# Patient Record
Sex: Male | Born: 1975 | Race: Black or African American | Hispanic: No | Marital: Single | Smoking: Current every day smoker
Health system: Southern US, Community
[De-identification: ages and names within clinical notes are randomized; demographics above are authoritative.]

## PROBLEM LIST (undated history)

## (undated) DIAGNOSIS — I10 Essential (primary) hypertension: Secondary | ICD-10-CM

## (undated) DIAGNOSIS — Z21 Asymptomatic human immunodeficiency virus [HIV] infection status: Secondary | ICD-10-CM

## (undated) DIAGNOSIS — B20 Human immunodeficiency virus [HIV] disease: Secondary | ICD-10-CM

## (undated) DIAGNOSIS — F1020 Alcohol dependence, uncomplicated: Secondary | ICD-10-CM

## (undated) HISTORY — DX: Human immunodeficiency virus (HIV) disease: B20

## (undated) HISTORY — DX: Alcohol dependence, uncomplicated: F10.20

## (undated) HISTORY — DX: Asymptomatic human immunodeficiency virus (hiv) infection status: Z21

## (undated) HISTORY — PX: HAND SURGERY: SHX662

## (undated) HISTORY — DX: Essential (primary) hypertension: I10

---

## 1997-12-05 ENCOUNTER — Emergency Department (HOSPITAL_COMMUNITY): Admission: EM | Admit: 1997-12-05 | Discharge: 1997-12-05 | Payer: Self-pay | Admitting: Emergency Medicine

## 2004-04-29 ENCOUNTER — Emergency Department (HOSPITAL_COMMUNITY): Admission: EM | Admit: 2004-04-29 | Discharge: 2004-04-29 | Payer: Self-pay | Admitting: Emergency Medicine

## 2004-05-02 ENCOUNTER — Emergency Department (HOSPITAL_COMMUNITY): Admission: EM | Admit: 2004-05-02 | Discharge: 2004-05-02 | Payer: Self-pay | Admitting: Emergency Medicine

## 2006-05-11 ENCOUNTER — Inpatient Hospital Stay (HOSPITAL_COMMUNITY): Admission: EM | Admit: 2006-05-11 | Discharge: 2006-05-12 | Payer: Self-pay | Admitting: Emergency Medicine

## 2012-12-06 ENCOUNTER — Encounter (HOSPITAL_COMMUNITY): Payer: Self-pay | Admitting: Emergency Medicine

## 2012-12-06 ENCOUNTER — Emergency Department (INDEPENDENT_AMBULATORY_CARE_PROVIDER_SITE_OTHER)
Admission: EM | Admit: 2012-12-06 | Discharge: 2012-12-06 | Disposition: A | Discharge status: Home / Self Care | Payer: Self-pay | Source: Home / Self Care | Attending: Emergency Medicine | Admitting: Emergency Medicine

## 2012-12-06 DIAGNOSIS — A084 Viral intestinal infection, unspecified: Secondary | ICD-10-CM

## 2012-12-06 DIAGNOSIS — A088 Other specified intestinal infections: Secondary | ICD-10-CM

## 2012-12-06 MED ORDER — ONDANSETRON 8 MG PO TBDP: 8.0000 mg | ORAL_TABLET | Freq: Three times a day (TID) | ORAL | Status: DC | PRN

## 2012-12-06 MED ORDER — ONDANSETRON 4 MG PO TBDP
8.0000 mg | ORAL_TABLET | Freq: Once | ORAL | Status: AC
  Administered 2012-12-06: 8 mg via ORAL

## 2012-12-06 MED ORDER — GI COCKTAIL ~~LOC~~
30.0000 mL | Freq: Once | ORAL | Status: AC
  Administered 2012-12-06: 30 mL via ORAL

## 2012-12-06 MED ORDER — ONDANSETRON 4 MG PO TBDP
ORAL_TABLET | ORAL | Status: AC
  Filled 2012-12-06: qty 2

## 2012-12-06 MED ORDER — GI COCKTAIL ~~LOC~~
ORAL | Status: AC
  Filled 2012-12-06: qty 30

## 2012-12-06 MED ORDER — DIPHENOXYLATE-ATROPINE 2.5-0.025 MG PO TABS: 1.0000 | ORAL_TABLET | Freq: Four times a day (QID) | ORAL | Status: DC | PRN

## 2012-12-06 NOTE — ED Provider Notes (Signed)
Chief Complaint:   Chief Complaint  Patient presents with  . Abdominal Pain    History of Present Illness:   Gerald Guzman is a 37 year old male who's had a four-day history of nausea, vomiting, diarrhea, epigastric, crampy abdominal pain, and borborygmus. He vomited once yesterday and has not vomited today. He's been able to keep some liquids down. There's been no blood in the vomitus, no coffee-ground emesis or bilious emesis. He had one diarrheal stool yesterday and one today. He denies any blood in the stool or melena. He's had no fever, chills, sweats, aches, or headache. He's been around several nieces and nephews who had similar symptoms. He denies any suspicious ingestions or recent foreign travel. No animal exposure.  Review of Systems:  Other than noted above, the patient denies any of the following symptoms: Systemic:  No fevers, chills, sweats, weight loss or gain, fatigue, or tiredness. ENT:  No nasal congestion, rhinorrhea, or sore throat. Lungs:  No cough, wheezing, or shortness of breath. Cardiac:  No chest pain, syncope, or presyncope. GI:  No abdominal pain, nausea, vomiting, anorexia, diarrhea, constipation, blood in stool or vomitus. GU:  No dysuria, frequency, or urgency.  PMFSH:  Past medical history, family history, social history, meds, and allergies were reviewed.    Physical Exam:   Vital signs:  BP 133/92  Pulse 57  Temp(Src) 98.3 F (36.8 C) (Oral)  Resp 16  SpO2 97% General:  Alert and oriented.  In no distress.  Skin warm and dry.  Good skin turgor, brisk capillary refill. ENT:  No scleral icterus, moist mucous membranes, no oral lesions, pharynx clear. Lungs:  Breath sounds clear and equal bilaterally.  No wheezes, rales, or rhonchi. Heart:  Rhythm regular, without extrasystoles.  No gallops or murmers. Abdomen:  Soft, flat, nondistended. He has slight epigastric and periumbilical tenderness to palpation without guarding or rebound. No organomegaly or  mass. Bowel sounds are hyperactive with loud borborygmus. Skin: Clear, warm, and dry.  Good turgor.  Brisk capillary refill.  Course in Urgent Care Center:   He was given GI cocktail 30 mL by mouth and Zofran ODT 8 mg sublingually.  Assessment:  The encounter diagnosis was Viral gastroenteritis.  No evidence of dehydration.  Plan:   1.  Meds:  The following meds were prescribed:   New Prescriptions   DIPHENOXYLATE-ATROPINE (LOMOTIL) 2.5-0.025 MG PER TABLET    Take 1 tablet by mouth 4 (four) times daily as needed for diarrhea or loose stools.   ONDANSETRON (ZOFRAN ODT) 8 MG DISINTEGRATING TABLET    Take 1 tablet (8 mg total) by mouth every 8 (eight) hours as needed for nausea.    2.  Patient Education/Counseling:  The patient was given appropriate handouts, self care instructions, and instructed in symptomatic relief. The patient was told to stay on clear liquids for the remainder of the day, then advance to a B.R.A.T. diet starting tomorrow.   3.  Follow up:  The patient was told to follow up if no better in 3 to 4 days, if becoming worse in any way, and given some red flag symptoms such as persistent vomiting, fever, or severe abdominal pain which would prompt immediate return.  Follow up here if necessary.       Reuben Likes, MD 12/06/12 [phone]

## 2012-12-06 NOTE — ED Notes (Signed)
General abdominal pain, nausea, vomiting, and diarrhea

## 2013-05-25 ENCOUNTER — Encounter (HOSPITAL_COMMUNITY): Payer: Self-pay | Admitting: Emergency Medicine

## 2013-05-25 ENCOUNTER — Emergency Department (HOSPITAL_COMMUNITY)
Admission: EM | Admit: 2013-05-25 | Discharge: 2013-05-25 | Disposition: A | Discharge status: Home / Self Care | Payer: Self-pay | Attending: Emergency Medicine | Admitting: Emergency Medicine

## 2013-05-25 DIAGNOSIS — F17200 Nicotine dependence, unspecified, uncomplicated: Secondary | ICD-10-CM | POA: Insufficient documentation

## 2013-05-25 DIAGNOSIS — K2970 Gastritis, unspecified, without bleeding: Secondary | ICD-10-CM | POA: Insufficient documentation

## 2013-05-25 DIAGNOSIS — K2990 Gastroduodenitis, unspecified, without bleeding: Principal | ICD-10-CM

## 2013-05-25 LAB — CBC WITH DIFFERENTIAL/PLATELET
BASOS ABS: 0 10*3/uL (ref 0.0–0.1)
Basophils Relative: 0 % (ref 0–1)
Eosinophils Absolute: 0.1 10*3/uL (ref 0.0–0.7)
Eosinophils Relative: 1 % (ref 0–5)
HEMATOCRIT: 46.4 % (ref 39.0–52.0)
HEMOGLOBIN: 16.1 g/dL (ref 13.0–17.0)
LYMPHS ABS: 2.7 10*3/uL (ref 0.7–4.0)
LYMPHS PCT: 36 % (ref 12–46)
MCH: 32.8 pg (ref 26.0–34.0)
MCHC: 34.7 g/dL (ref 30.0–36.0)
MCV: 94.5 fL (ref 78.0–100.0)
Monocytes Absolute: 0.5 10*3/uL (ref 0.1–1.0)
Monocytes Relative: 6 % (ref 3–12)
Neutro Abs: 4.2 10*3/uL (ref 1.7–7.7)
Neutrophils Relative %: 57 % (ref 43–77)
Platelets: 233 10*3/uL (ref 150–400)
RBC: 4.91 MIL/uL (ref 4.22–5.81)
RDW: 13.5 % (ref 11.5–15.5)
WBC: 7.4 10*3/uL (ref 4.0–10.5)

## 2013-05-25 LAB — COMPREHENSIVE METABOLIC PANEL
ALBUMIN: 4 g/dL (ref 3.5–5.2)
ALK PHOS: 75 U/L (ref 39–117)
ALT: 7 U/L (ref 0–53)
AST: 29 U/L (ref 0–37)
BILIRUBIN TOTAL: 0.4 mg/dL (ref 0.3–1.2)
BUN: 7 mg/dL (ref 6–23)
CHLORIDE: 100 meq/L (ref 96–112)
CO2: 26 mEq/L (ref 19–32)
CREATININE: 1.18 mg/dL (ref 0.50–1.35)
Calcium: 9.1 mg/dL (ref 8.4–10.5)
GFR calc non Af Amer: 77 mL/min — ABNORMAL LOW (ref >90)
GFR, EST AFRICAN AMERICAN: 89 mL/min — AB (ref >90)
GLUCOSE: 97 mg/dL (ref 70–99)
POTASSIUM: 4.3 meq/L (ref 3.7–5.3)
SODIUM: 139 meq/L (ref 137–147)
TOTAL PROTEIN: 7.6 g/dL (ref 6.0–8.3)

## 2013-05-25 LAB — URINALYSIS, ROUTINE W REFLEX MICROSCOPIC
Bilirubin Urine: NEGATIVE
GLUCOSE, UA: NEGATIVE mg/dL
Hgb urine dipstick: NEGATIVE
KETONES UR: NEGATIVE mg/dL
NITRITE: NEGATIVE
PH: 5 (ref 5.0–8.0)
PROTEIN: NEGATIVE mg/dL
Specific Gravity, Urine: 1.019 (ref 1.005–1.030)
UROBILINOGEN UA: 0.2 mg/dL (ref 0.0–1.0)

## 2013-05-25 LAB — URINE MICROSCOPIC-ADD ON

## 2013-05-25 LAB — LIPASE, BLOOD: Lipase: 17 U/L (ref 11–59)

## 2013-05-25 MED ORDER — PANTOPRAZOLE SODIUM 20 MG PO TBEC: 20.0000 mg | DELAYED_RELEASE_TABLET | Freq: Every day | ORAL | Status: DC

## 2013-05-25 MED ORDER — SUCRALFATE 1 G PO TABS: 1.0000 g | ORAL_TABLET | Freq: Four times a day (QID) | ORAL | Status: DC

## 2013-05-25 NOTE — Discharge Instructions (Signed)

## 2013-05-25 NOTE — ED Notes (Addendum)
Pt reports n/v since Monday. Denies bowel/bladder changes

## 2013-05-25 NOTE — ED Notes (Signed)
MD at bedside. 

## 2013-05-25 NOTE — ED Provider Notes (Signed)
CSN: [id number]     Arrival date & time 05/25/13  1219 History   First MD Initiated Contact with Patient 05/25/13 1240     Chief Complaint  Patient presents with  . Nausea     (Consider location/radiation/quality/duration/timing/severity/associated sxs/prior Treatment) The history is provided by the patient.   Patient here complaining of nausea and diarrhea that is worse with eating greasy foods and better with a bland diet. Denies any fever or chills. Denies any abdominal pain. Symptoms have gradually improved over the past week. He is concerned because he's had watery diarrhea. Denies any antibiotic use. No exposure C. difficile. No recent travel history. Denies exposure to gastroenteritis. Denies any weakness or dizziness when standing. No URI symptoms. History reviewed. No pertinent past medical history. History reviewed. No pertinent past surgical history. History reviewed. No pertinent family history. History  Substance Use Topics  . Smoking status: Current Every Day Smoker    Types: Cigarettes  . Smokeless tobacco: Not on file  . Alcohol Use: Yes    Review of Systems  All other systems reviewed and are negative.     Allergies  Review of patient's allergies indicates no known allergies.  Home Medications   Prior to Admission medications   Medication Sig Start Date End Date Taking? Authorizing Provider  diphenoxylate-atropine (LOMOTIL) 2.5-0.025 MG per tablet Take 1 tablet by mouth 4 (four) times daily as needed for diarrhea or loose stools. 12/06/12   Reuben Likesavid C Keller, MD  ondansetron (ZOFRAN ODT) 8 MG disintegrating tablet Take 1 tablet (8 mg total) by mouth every 8 (eight) hours as needed for nausea. 12/06/12   Reuben Likesavid C Keller, MD   BP 132/93  Pulse 65  Temp(Src) 98.2 F (36.8 C) (Oral)  Resp 16  Ht 6\' 2"  (1.88 m)  Wt 200 lb 4 oz (90.833 kg)  BMI 25.70 kg/m2  SpO2 97% Physical Exam  Nursing note and vitals reviewed. Constitutional: He is oriented to person,  place, and time. He appears well-developed and well-nourished.  Non-toxic appearance. No distress.  HENT:  Head: Normocephalic and atraumatic.  Eyes: Conjunctivae, EOM and lids are normal. Pupils are equal, round, and reactive to light.  Neck: Normal range of motion. Neck supple. No tracheal deviation present. No mass present.  Cardiovascular: Normal rate, regular rhythm and normal heart sounds.  Exam reveals no gallop.   No murmur heard. Pulmonary/Chest: Effort normal and breath sounds normal. No stridor. No respiratory distress. He has no decreased breath sounds. He has no wheezes. He has no rhonchi. He has no rales.  Abdominal: Soft. Normal appearance and bowel sounds are normal. He exhibits no distension. There is no tenderness. There is no rebound and no CVA tenderness.  Musculoskeletal: Normal range of motion. He exhibits no edema and no tenderness.  Neurological: He is alert and oriented to person, place, and time. He has normal strength. No cranial nerve deficit or sensory deficit. GCS eye subscore is 4. GCS verbal subscore is 5. GCS motor subscore is 6.  Skin: Skin is warm and dry. No abrasion and no rash noted.  Psychiatric: He has a normal mood and affect. His speech is normal and behavior is normal.    ED Course  Procedures (including critical care time) Labs Review Labs Reviewed  COMPREHENSIVE METABOLIC PANEL - Abnormal; Notable for the following:    GFR calc non Af Amer 77 (*)    GFR calc Af Amer 89 (*)    All other components within normal limits  URINALYSIS,  ROUTINE W REFLEX MICROSCOPIC - Abnormal; Notable for the following:    Leukocytes, UA SMALL (*)    All other components within normal limits  CBC WITH DIFFERENTIAL  LIPASE, BLOOD  URINE MICROSCOPIC-ADD ON    Imaging Review No results found.   EKG Interpretation None      MDM   Final diagnoses:  None    Patient to be treated for likely gastritis and is stable for discharge    Toy BakerAnthony T Yaxiel Minnie,  MD 05/25/13 1404

## 2013-12-17 ENCOUNTER — Telehealth: Payer: Self-pay

## 2013-12-17 NOTE — Telephone Encounter (Signed)
Patient contacted regarding new intake appointment. Date and time given. Information given regarding documents needed to qualify for financial eligibility.  Raad Clayson K Umberto Pavek, RN  

## 2014-01-01 ENCOUNTER — Ambulatory Visit (INDEPENDENT_AMBULATORY_CARE_PROVIDER_SITE_OTHER): Payer: Self-pay

## 2014-01-01 ENCOUNTER — Ambulatory Visit: Payer: Self-pay | Admitting: Licensed Clinical Social Worker

## 2014-01-01 DIAGNOSIS — B20 Human immunodeficiency virus [HIV] disease: Secondary | ICD-10-CM

## 2014-01-01 DIAGNOSIS — Z79899 Other long term (current) drug therapy: Secondary | ICD-10-CM

## 2014-01-01 DIAGNOSIS — Z23 Encounter for immunization: Secondary | ICD-10-CM

## 2014-01-01 DIAGNOSIS — Z113 Encounter for screening for infections with a predominantly sexual mode of transmission: Secondary | ICD-10-CM

## 2014-01-01 NOTE — Progress Notes (Signed)
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 Patient is in a current relationship of 8 months with a partner that is HIV positive and he believes this is how he was infected. Patient states he has never used condoms with his girlfriend. Patient was very calm appearing and did not have any questions regarding his new diagnosis. [facility], I updated his chart. Patient will return in 2 weeks to see Dr. Daiva EvesVan Dam. Patient given appointment.

## 2014-01-02 LAB — COMPLETE METABOLIC PANEL WITH GFR
ALT: 9 U/L (ref 0–53)
AST: 24 U/L (ref 0–37)
Albumin: 4.3 g/dL (ref 3.5–5.2)
Alkaline Phosphatase: 62 U/L (ref 39–117)
BILIRUBIN TOTAL: 0.7 mg/dL (ref 0.2–1.2)
BUN: 11 mg/dL (ref 6–23)
CALCIUM: 9.6 mg/dL (ref 8.4–10.5)
CHLORIDE: 103 meq/L (ref 96–112)
CO2: 26 meq/L (ref 19–32)
CREATININE: 1.04 mg/dL (ref 0.50–1.35)
GLUCOSE: 142 mg/dL — AB (ref 70–99)
Potassium: 4.6 mEq/L (ref 3.5–5.3)
Sodium: 139 mEq/L (ref 135–145)
Total Protein: 7.3 g/dL (ref 6.0–8.3)

## 2014-01-02 LAB — LIPID PANEL
CHOL/HDL RATIO: 3.2 ratio
Cholesterol: 197 mg/dL (ref 0–200)
HDL: 61 mg/dL (ref >39)
LDL Cholesterol: 121 mg/dL — ABNORMAL HIGH (ref 0–99)
TRIGLYCERIDES: 73 mg/dL (ref <150)
VLDL: 15 mg/dL (ref 0–40)

## 2014-01-02 LAB — URINALYSIS
Bilirubin Urine: NEGATIVE
Glucose, UA: NEGATIVE mg/dL
Hgb urine dipstick: NEGATIVE
Ketones, ur: NEGATIVE mg/dL
Leukocytes, UA: NEGATIVE
Nitrite: NEGATIVE
Protein, ur: NEGATIVE mg/dL
Specific Gravity, Urine: 1.023 (ref 1.005–1.030)
Urobilinogen, UA: 0.2 mg/dL (ref 0.0–1.0)
pH: 5 (ref 5.0–8.0)

## 2014-01-02 LAB — CBC WITH DIFFERENTIAL/PLATELET
Basophils Absolute: 0 K/uL (ref 0.0–0.1)
Basophils Relative: 0 % (ref 0–1)
Eosinophils Absolute: 0 K/uL (ref 0.0–0.7)
Eosinophils Relative: 1 % (ref 0–5)
HCT: 47.5 % (ref 39.0–52.0)
Hemoglobin: 16.5 g/dL (ref 13.0–17.0)
Lymphocytes Relative: 30 % (ref 12–46)
Lymphs Abs: 1.4 K/uL (ref 0.7–4.0)
MCH: 31.4 pg (ref 26.0–34.0)
MCHC: 34.7 g/dL (ref 30.0–36.0)
MCV: 90.5 fL (ref 78.0–100.0)
MPV: 10.3 fL (ref 9.4–12.4)
Monocytes Absolute: 0.5 K/uL (ref 0.1–1.0)
Monocytes Relative: 10 % (ref 3–12)
Neutro Abs: 2.7 K/uL (ref 1.7–7.7)
Neutrophils Relative %: 59 % (ref 43–77)
Platelets: 209 K/uL (ref 150–400)
RBC: 5.25 MIL/uL (ref 4.22–5.81)
RDW: 13.7 % (ref 11.5–15.5)
WBC: 4.5 K/uL (ref 4.0–10.5)

## 2014-01-02 LAB — T-HELPER CELL (CD4) - (RCID CLINIC ONLY)
CD4 % Helper T Cell: 14 % — ABNORMAL LOW (ref 33–55)
CD4 T Cell Abs: 210 /uL — ABNORMAL LOW (ref 400–2700)

## 2014-01-02 LAB — HIV-1 RNA ULTRAQUANT REFLEX TO GENTYP+
HIV 1 RNA Quant: 16730 {copies}/mL — ABNORMAL HIGH
HIV-1 RNA Quant, Log: 4.22 {Log} — ABNORMAL HIGH

## 2014-01-02 LAB — HEPATITIS B SURFACE ANTIBODY,QUALITATIVE: Hep B S Ab: NEGATIVE

## 2014-01-02 LAB — HEPATITIS B CORE ANTIBODY, TOTAL: Hep B Core Total Ab: NONREACTIVE

## 2014-01-02 LAB — HEPATITIS A ANTIBODY, TOTAL: HEP A TOTAL AB: NONREACTIVE

## 2014-01-02 LAB — URINE CYTOLOGY ANCILLARY ONLY
Chlamydia: POSITIVE — AB
NEISSERIA GONORRHEA: NEGATIVE

## 2014-01-02 LAB — HEPATITIS B SURFACE ANTIGEN: HEP B S AG: NEGATIVE

## 2014-01-02 LAB — HEPATITIS C ANTIBODY: HCV Ab: NEGATIVE

## 2014-01-02 LAB — SYPHILIS: RPR W/REFLEX TO RPR TITER AND TREPONEMAL ANTIBODIES, TRADITIONAL SCREENING AND DIAGNOSIS ALGORITHM

## 2014-01-05 ENCOUNTER — Other Ambulatory Visit: Payer: Self-pay | Admitting: *Deleted

## 2014-01-05 ENCOUNTER — Telehealth: Payer: Self-pay | Admitting: *Deleted

## 2014-01-05 LAB — QUANTIFERON TB GOLD ASSAY (BLOOD)
INTERFERON GAMMA RELEASE ASSAY: NEGATIVE
MITOGEN VALUE: 9.21 [IU]/mL
QUANTIFERON NIL VALUE: 0.03 [IU]/mL
QUANTIFERON TB AG MINUS NIL: 0 [IU]/mL
TB Ag value: 0.03 IU/mL

## 2014-01-05 MED ORDER — EFAVIRENZ-EMTRICITAB-TENOFOVIR 600-200-300 MG PO TABS: 1.0000 | ORAL_TABLET | Freq: Every day | ORAL | Status: DC

## 2014-01-05 NOTE — Telephone Encounter (Signed)
Left a message for patient to call the clinic. He needs to come in for treatment. Gerald Guzman

## 2014-01-05 NOTE — Telephone Encounter (Signed)
-----   Message from Randall Hissornelius N Van Dam, MD sent at 01/02/2014  4:57 PM EST ----- Patient needs a dose of Azthromycin 1 gram to cure his chlamydia

## 2014-01-07 ENCOUNTER — Ambulatory Visit (INDEPENDENT_AMBULATORY_CARE_PROVIDER_SITE_OTHER): Payer: Self-pay | Admitting: *Deleted

## 2014-01-07 ENCOUNTER — Telehealth: Payer: Self-pay | Admitting: *Deleted

## 2014-01-07 DIAGNOSIS — A749 Chlamydial infection, unspecified: Secondary | ICD-10-CM

## 2014-01-07 LAB — HLA B*5701: HLA-B*5701 w/rflx HLA-B High: NEGATIVE

## 2014-01-07 MED ORDER — AZITHROMYCIN 250 MG PO TABS: 1000.0000 mg | ORAL_TABLET | Freq: Once | ORAL | Status: DC

## 2014-01-07 MED ORDER — AZITHROMYCIN 250 MG PO TABS
1000.0000 mg | ORAL_TABLET | Freq: Once | ORAL | Status: AC
  Administered 2014-01-07: 1000 mg via ORAL

## 2014-01-07 NOTE — Progress Notes (Signed)
STI treatment per Dr. Daiva EvesVan Dam.  Patient needs a dose of Azthromycin 1 gram to cure his chlamydia.

## 2014-01-07 NOTE — Patient Instructions (Signed)
Pt instructed to use condoms during intercourse.  Pt verbalized understanding.  Return appt for new dx B20 next Wed., Dec 16 w/ Dr. Daiva EvesVan Dam.

## 2014-01-07 NOTE — Progress Notes (Signed)
Patient notified and will come today for treatment.

## 2014-01-07 NOTE — Telephone Encounter (Signed)
Scheduled pt for appt 01/07/14 @ 1030.    Notes Recorded by Randall Hissornelius N Van Dam, MD on 01/02/2014 at 4:57 PM Patient needs a dose of Azthromycin 1 gram to cure his chlamydia

## 2014-01-13 LAB — HIV-1 GENOTYPR PLUS

## 2014-01-14 ENCOUNTER — Ambulatory Visit (INDEPENDENT_AMBULATORY_CARE_PROVIDER_SITE_OTHER): Payer: Self-pay | Admitting: Infectious Disease

## 2014-01-14 ENCOUNTER — Encounter: Payer: Self-pay | Admitting: Infectious Disease

## 2014-01-14 VITALS — BP 154/100 | HR 89 | Temp 98.3°F | Ht 75.0 in | Wt 188.0 lb

## 2014-01-14 DIAGNOSIS — Z720 Tobacco use: Secondary | ICD-10-CM

## 2014-01-14 DIAGNOSIS — Z23 Encounter for immunization: Secondary | ICD-10-CM

## 2014-01-14 DIAGNOSIS — I10 Essential (primary) hypertension: Secondary | ICD-10-CM

## 2014-01-14 DIAGNOSIS — F17200 Nicotine dependence, unspecified, uncomplicated: Secondary | ICD-10-CM | POA: Insufficient documentation

## 2014-01-14 DIAGNOSIS — B20 Human immunodeficiency virus [HIV] disease: Secondary | ICD-10-CM

## 2014-01-14 MED ORDER — ELVITEG-COBIC-EMTRICIT-TENOFAF 150-150-200-10 MG PO TABS
1.0000 | ORAL_TABLET | Freq: Every day | ORAL | Status: DC
Start: 2014-01-14 — End: 2014-02-25

## 2014-01-14 MED ORDER — ELVITEG-COBIC-EMTRICIT-TENOFAF 150-150-200-10 MG PO TABS: 1.0000 | ORAL_TABLET | Freq: Every day | ORAL | Status: DC

## 2014-01-14 NOTE — Progress Notes (Signed)
   Subjective:    Patient ID: Gerald Guzman, male    DOB: 1975/03/30, 38 y.o.   MRN: [id number]  HPI  38 year old Afro-American man with history of tobacco use but previously no medical problems who has been recently diagnosed with HIV viral load is  Lab Results  Component Value Date   HIV1RNAQUANT 16730* 01/01/2014   And his CD4 count is :  Lab Results  Component Value Date   CD4TABS 210* 01/01/2014   His HIV genotype shows no resistance mutations. He is HLA [id number] negative   Review of Systems  Constitutional: Negative for fever, chills, diaphoresis, activity change, appetite change, fatigue and unexpected weight change.  HENT: Negative for congestion, rhinorrhea, sinus pressure, sneezing, sore throat and trouble swallowing.   Eyes: Negative for photophobia and visual disturbance.  Respiratory: Negative for cough, chest tightness, shortness of breath, wheezing and stridor.   Cardiovascular: Negative for chest pain, palpitations and leg swelling.  Gastrointestinal: Negative for nausea, vomiting, abdominal pain, diarrhea, constipation, blood in stool, abdominal distention and anal bleeding.  Genitourinary: Negative for dysuria, hematuria, flank pain and difficulty urinating.  Musculoskeletal: Negative for myalgias, back pain, joint swelling, arthralgias and gait problem.  Skin: Negative for color change, pallor, rash and wound.  Neurological: Negative for dizziness, tremors, weakness and light-headedness.  Hematological: Negative for adenopathy. Does not bruise/bleed easily.  Psychiatric/Behavioral: Negative for behavioral problems, confusion, sleep disturbance, dysphoric mood, decreased concentration and agitation.       Objective:   Physical Exam  Constitutional: He is oriented to person, place, and time. He appears well-developed and well-nourished.  HENT:  Head: Normocephalic and atraumatic.  Eyes: Conjunctivae and EOM are normal.  Neck: Normal range of motion. Neck  supple.  Cardiovascular: Normal rate and regular rhythm.   Pulmonary/Chest: Effort normal. No respiratory distress. He has no wheezes.  Abdominal: Soft. He exhibits no distension.  Musculoskeletal: Normal range of motion. He exhibits no edema or tenderness.  Neurological: He is alert and oriented to person, place, and time.  Skin: Skin is warm and dry. No rash noted. No erythema. No pallor.  Psychiatric: He has a normal mood and affect. His behavior is normal. Judgment and thought content normal.  Nursing note and vitals reviewed.         Assessment & Plan:    #1 HIV: He states that he tested negative for HIV one year ago. Certainly CD4 count is quite low now and I feel he is an urgent need of therapy.  He has signed up for right white and will enroll in a depth. We had extensive discussion with regards to recommended therapies for HIV including all the single tablet regimens including also TIVICAY and Truvada PREZCOBIX and Truvada and EVOTAZ and Truvada. Ultimately felt that the patient could do well on a regimen of GENVOYA and I wrote script for this today and gave him copay card until his ADAP can kick in. I spent greater than 60 minutes with the patient including greater than 50% of time in face to face counsel of the patient and in coordination of their care.  #2 Smoking: counselled him to quit  #3 HTN: Have some degree of whitecoat hypertension but likely also has baseline hypertension that may need to be addressed with medications

## 2014-02-16 ENCOUNTER — Other Ambulatory Visit (INDEPENDENT_AMBULATORY_CARE_PROVIDER_SITE_OTHER): Payer: Self-pay

## 2014-02-16 DIAGNOSIS — B20 Human immunodeficiency virus [HIV] disease: Secondary | ICD-10-CM

## 2014-02-16 DIAGNOSIS — Z113 Encounter for screening for infections with a predominantly sexual mode of transmission: Secondary | ICD-10-CM

## 2014-02-16 LAB — CBC WITH DIFFERENTIAL/PLATELET
BASOS ABS: 0 10*3/uL (ref 0.0–0.1)
Basophils Relative: 0 % (ref 0–1)
EOS ABS: 0.1 10*3/uL (ref 0.0–0.7)
Eosinophils Relative: 1 % (ref 0–5)
HCT: 45.7 % (ref 39.0–52.0)
HEMOGLOBIN: 16 g/dL (ref 13.0–17.0)
LYMPHS PCT: 26 % (ref 12–46)
Lymphs Abs: 1.6 10*3/uL (ref 0.7–4.0)
MCH: 31.4 pg (ref 26.0–34.0)
MCHC: 35 g/dL (ref 30.0–36.0)
MCV: 89.6 fL (ref 78.0–100.0)
MONO ABS: 0.4 10*3/uL (ref 0.1–1.0)
MONOS PCT: 6 % (ref 3–12)
MPV: 10.4 fL (ref 8.6–12.4)
NEUTROS ABS: 4.2 10*3/uL (ref 1.7–7.7)
Neutrophils Relative %: 67 % (ref 43–77)
Platelets: 226 10*3/uL (ref 150–400)
RBC: 5.1 MIL/uL (ref 4.22–5.81)
RDW: 14 % (ref 11.5–15.5)
WBC: 6.3 10*3/uL (ref 4.0–10.5)

## 2014-02-16 LAB — COMPLETE METABOLIC PANEL WITH GFR
ALBUMIN: 4.2 g/dL (ref 3.5–5.2)
ALK PHOS: 62 U/L (ref 39–117)
AST: 19 U/L (ref 0–37)
BILIRUBIN TOTAL: 0.6 mg/dL (ref 0.2–1.2)
BUN: 11 mg/dL (ref 6–23)
CALCIUM: 9.7 mg/dL (ref 8.4–10.5)
CO2: 27 mEq/L (ref 19–32)
Chloride: 102 mEq/L (ref 96–112)
Creat: 1.26 mg/dL (ref 0.50–1.35)
GFR, EST AFRICAN AMERICAN: 83 mL/min
GFR, EST NON AFRICAN AMERICAN: 71 mL/min
Glucose, Bld: 112 mg/dL — ABNORMAL HIGH (ref 70–99)
POTASSIUM: 4.8 meq/L (ref 3.5–5.3)
Sodium: 139 mEq/L (ref 135–145)
TOTAL PROTEIN: 7.4 g/dL (ref 6.0–8.3)

## 2014-02-17 LAB — RPR

## 2014-02-17 LAB — T-HELPER CELL (CD4) - (RCID CLINIC ONLY)
CD4 % Helper T Cell: 18 % — ABNORMAL LOW (ref 33–55)
CD4 T Cell Abs: 320 /uL — ABNORMAL LOW (ref 400–2700)

## 2014-02-17 LAB — URINE CYTOLOGY ANCILLARY ONLY
Chlamydia: NEGATIVE
Neisseria Gonorrhea: NEGATIVE

## 2014-02-17 LAB — HIV-1 RNA QUANT-NO REFLEX-BLD
HIV 1 RNA Quant: 67 copies/mL — ABNORMAL HIGH (ref <20)
HIV-1 RNA Quant, Log: 1.83 {Log} — ABNORMAL HIGH (ref <1.30)

## 2014-02-23 ENCOUNTER — Encounter: Payer: Self-pay | Admitting: Infectious Disease

## 2014-02-23 ENCOUNTER — Ambulatory Visit (INDEPENDENT_AMBULATORY_CARE_PROVIDER_SITE_OTHER): Payer: Self-pay | Admitting: Infectious Disease

## 2014-02-23 VITALS — BP 153/93 | HR 79 | Temp 98.5°F | Wt 197.0 lb

## 2014-02-23 DIAGNOSIS — I10 Essential (primary) hypertension: Secondary | ICD-10-CM

## 2014-02-23 DIAGNOSIS — B20 Human immunodeficiency virus [HIV] disease: Secondary | ICD-10-CM

## 2014-02-23 NOTE — Progress Notes (Signed)
   Subjective:    Patient ID: Gerald Guzman, male    DOB: Mar 31, 1975, 39 y.o.   MRN: [id number]  HPI   39 year old African-American man with history of tobacco use but previously no medical problems who has been recently diagnosed with HIV. His virus has come under nice control with GENVOYA  Lab Results  Component Value Date   HIV1RNAQUANT 67* 02/16/2014   And his CD4 count is :  Lab Results  Component Value Date   CD4TABS 320* 02/16/2014   CD4TABS 210* 01/01/2014    Review of Systems  Constitutional: Negative for fever, chills, diaphoresis, activity change, appetite change, fatigue and unexpected weight change.  HENT: Negative for congestion, rhinorrhea, sinus pressure, sneezing, sore throat and trouble swallowing.   Eyes: Negative for photophobia and visual disturbance.  Respiratory: Negative for cough, chest tightness, shortness of breath, wheezing and stridor.   Cardiovascular: Negative for chest pain, palpitations and leg swelling.  Gastrointestinal: Negative for nausea, vomiting, abdominal pain, diarrhea, constipation, blood in stool, abdominal distention and anal bleeding.  Genitourinary: Negative for dysuria, hematuria, flank pain and difficulty urinating.  Musculoskeletal: Negative for myalgias, back pain, joint swelling, arthralgias and gait problem.  Skin: Negative for color change, pallor, rash and wound.  Neurological: Negative for dizziness, tremors, weakness and light-headedness.  Hematological: Negative for adenopathy. Does not bruise/bleed easily.  Psychiatric/Behavioral: Negative for behavioral problems, confusion, sleep disturbance, dysphoric mood, decreased concentration and agitation.       Objective:   Physical Exam  Constitutional: He is oriented to person, place, and time. He appears well-developed and well-nourished.  HENT:  Head: Normocephalic and atraumatic.  Eyes: Conjunctivae and EOM are normal.  Neck: Normal range of motion. Neck supple.    Cardiovascular: Normal rate and regular rhythm.   Pulmonary/Chest: Effort normal. No respiratory distress. He has no wheezes.  Abdominal: Soft. He exhibits no distension.  Musculoskeletal: Normal range of motion. He exhibits no edema or tenderness.  Neurological: He is alert and oriented to person, place, and time.  Skin: Skin is warm and dry. No rash noted. No erythema. No pallor.  Psychiatric: He has a normal mood and affect. His behavior is normal. Judgment and thought content normal.  Nursing note and vitals reviewed.         Assessment & Plan:    #1 continueGENVOYA and renew ADAP  #2  HTN: Have some degree of whitecoat hypertension but likely also has baseline hypertension that may need to be addressed with medications

## 2014-02-23 NOTE — Patient Instructions (Signed)
Make sure you renew ADAP with [facility] this month  Make an appt for lab work in 4 weeks and appt with Dr. Daiva EvesVan Dam in 6 weeks

## 2014-02-25 ENCOUNTER — Other Ambulatory Visit: Payer: Self-pay | Admitting: *Deleted

## 2014-02-25 DIAGNOSIS — B20 Human immunodeficiency virus [HIV] disease: Secondary | ICD-10-CM

## 2014-02-25 MED ORDER — ELVITEG-COBIC-EMTRICIT-TENOFAF 150-150-200-10 MG PO TABS: 1.0000 | ORAL_TABLET | Freq: Every day | ORAL | Status: DC

## 2014-03-23 ENCOUNTER — Other Ambulatory Visit: Payer: Self-pay

## 2014-03-31 ENCOUNTER — Other Ambulatory Visit: Payer: Self-pay

## 2014-04-06 ENCOUNTER — Other Ambulatory Visit: Payer: Self-pay

## 2014-04-09 ENCOUNTER — Ambulatory Visit: Payer: Self-pay | Admitting: Infectious Disease

## 2014-04-28 ENCOUNTER — Other Ambulatory Visit (INDEPENDENT_AMBULATORY_CARE_PROVIDER_SITE_OTHER): Payer: Self-pay

## 2014-04-28 DIAGNOSIS — B20 Human immunodeficiency virus [HIV] disease: Secondary | ICD-10-CM

## 2014-04-28 LAB — COMPLETE METABOLIC PANEL WITH GFR
ALT: <8 U/L (ref 0–53)
AST: 18 U/L (ref 0–37)
Albumin: 4.2 g/dL (ref 3.5–5.2)
Alkaline Phosphatase: 60 U/L (ref 39–117)
BUN: 12 mg/dL (ref 6–23)
CO2: 31 mEq/L (ref 19–32)
CREATININE: 1.31 mg/dL (ref 0.50–1.35)
Calcium: 9.6 mg/dL (ref 8.4–10.5)
Chloride: 107 mEq/L (ref 96–112)
GFR, Est African American: 79 mL/min
GFR, Est Non African American: 68 mL/min
Glucose, Bld: 82 mg/dL (ref 70–99)
Potassium: 5.1 mEq/L (ref 3.5–5.3)
Sodium: 141 mEq/L (ref 135–145)
TOTAL PROTEIN: 7.1 g/dL (ref 6.0–8.3)
Total Bilirubin: 0.5 mg/dL (ref 0.2–1.2)

## 2014-04-28 LAB — CBC WITH DIFFERENTIAL/PLATELET
BASOS PCT: 0 % (ref 0–1)
Basophils Absolute: 0 10*3/uL (ref 0.0–0.1)
EOS ABS: 0.1 10*3/uL (ref 0.0–0.7)
Eosinophils Relative: 1 % (ref 0–5)
HCT: 43.8 % (ref 39.0–52.0)
HEMOGLOBIN: 15.2 g/dL (ref 13.0–17.0)
LYMPHS PCT: 34 % (ref 12–46)
Lymphs Abs: 1.8 10*3/uL (ref 0.7–4.0)
MCH: 31.9 pg (ref 26.0–34.0)
MCHC: 34.7 g/dL (ref 30.0–36.0)
MCV: 91.8 fL (ref 78.0–100.0)
MONO ABS: 0.3 10*3/uL (ref 0.1–1.0)
MONOS PCT: 6 % (ref 3–12)
MPV: 10.2 fL (ref 8.6–12.4)
Neutro Abs: 3.1 10*3/uL (ref 1.7–7.7)
Neutrophils Relative %: 59 % (ref 43–77)
Platelets: 223 10*3/uL (ref 150–400)
RBC: 4.77 MIL/uL (ref 4.22–5.81)
RDW: 14 % (ref 11.5–15.5)
WBC: 5.2 10*3/uL (ref 4.0–10.5)

## 2014-04-29 LAB — HIV-1 RNA QUANT-NO REFLEX-BLD
HIV 1 RNA Quant: 36 copies/mL — ABNORMAL HIGH (ref <20)
HIV-1 RNA Quant, Log: 1.56 {Log} — ABNORMAL HIGH (ref <1.30)

## 2014-04-29 LAB — T-HELPER CELL (CD4) - (RCID CLINIC ONLY)
CD4 % Helper T Cell: 24 % — ABNORMAL LOW (ref 33–55)
CD4 T Cell Abs: 410 /uL (ref 400–2700)

## 2014-05-18 ENCOUNTER — Ambulatory Visit: Payer: Self-pay | Admitting: Infectious Disease

## 2014-07-12 ENCOUNTER — Emergency Department (HOSPITAL_COMMUNITY): Payer: Self-pay

## 2014-07-12 ENCOUNTER — Encounter (HOSPITAL_COMMUNITY): Payer: Self-pay | Admitting: *Deleted

## 2014-07-12 ENCOUNTER — Emergency Department (HOSPITAL_COMMUNITY)
Admission: EM | Admit: 2014-07-12 | Discharge: 2014-07-12 | Disposition: A | Discharge status: Home / Self Care | Payer: Self-pay | Attending: Emergency Medicine | Admitting: Emergency Medicine

## 2014-07-12 DIAGNOSIS — Z720 Tobacco use: Secondary | ICD-10-CM | POA: Insufficient documentation

## 2014-07-12 DIAGNOSIS — W19XXXA Unspecified fall, initial encounter: Secondary | ICD-10-CM

## 2014-07-12 DIAGNOSIS — S93401A Sprain of unspecified ligament of right ankle, initial encounter: Secondary | ICD-10-CM | POA: Insufficient documentation

## 2014-07-12 DIAGNOSIS — I10 Essential (primary) hypertension: Secondary | ICD-10-CM | POA: Insufficient documentation

## 2014-07-12 DIAGNOSIS — Y998 Other external cause status: Secondary | ICD-10-CM | POA: Insufficient documentation

## 2014-07-12 DIAGNOSIS — Y9351 Activity, roller skating (inline) and skateboarding: Secondary | ICD-10-CM | POA: Insufficient documentation

## 2014-07-12 DIAGNOSIS — S99921A Unspecified injury of right foot, initial encounter: Secondary | ICD-10-CM | POA: Insufficient documentation

## 2014-07-12 DIAGNOSIS — Y92410 Unspecified street and highway as the place of occurrence of the external cause: Secondary | ICD-10-CM | POA: Insufficient documentation

## 2014-07-12 DIAGNOSIS — S8991XA Unspecified injury of right lower leg, initial encounter: Secondary | ICD-10-CM | POA: Insufficient documentation

## 2014-07-12 DIAGNOSIS — Z21 Asymptomatic human immunodeficiency virus [HIV] infection status: Secondary | ICD-10-CM | POA: Insufficient documentation

## 2014-07-12 DIAGNOSIS — Z79899 Other long term (current) drug therapy: Secondary | ICD-10-CM | POA: Insufficient documentation

## 2014-07-12 MED ORDER — TRAMADOL HCL 50 MG PO TABS: 50.0000 mg | ORAL_TABLET | Freq: Four times a day (QID) | ORAL | Status: DC | PRN

## 2014-07-12 MED ORDER — HYDROCODONE-ACETAMINOPHEN 5-325 MG PO TABS
1.0000 | ORAL_TABLET | Freq: Once | ORAL | Status: AC
  Administered 2014-07-12: 1 via ORAL
  Filled 2014-07-12: qty 1

## 2014-07-12 MED ORDER — IBUPROFEN 400 MG PO TABS
600.0000 mg | ORAL_TABLET | Freq: Once | ORAL | Status: AC
  Administered 2014-07-12: 600 mg via ORAL
  Filled 2014-07-12: qty 2

## 2014-07-12 MED ORDER — CYCLOBENZAPRINE HCL 10 MG PO TABS: 10.0000 mg | ORAL_TABLET | Freq: Two times a day (BID) | ORAL | Status: DC | PRN

## 2014-07-12 NOTE — ED Provider Notes (Signed)
CSN: [id number]     Arrival date & time 07/12/14  1025 History  This chart was scribed for Gerald Pel, PA-C, working with Gerald Hutching, MD by Gerald Guzman, ED Scribe. The patient was seen in room TR08C/TR08C at 10:53 AM.    Chief Complaint  Patient presents with  . Knee Pain  . Ankle Pain  . Foot Pain      The history is provided by the patient. No language interpreter was used.   HPI Comments: Gerald Guzman is a 39 y.o. male with a medical hx of HIV and HTN who presents to the Emergency Department complaining of sharp, stabbing, right knee pain onset last night. Pt was rollerblading last night on a wooden surface at a skating ring. He reports that he went to take a step and he fell and his right leg bent underneath him onto carpeted area. Pt is not able to ambulate without pain. He states that he is having associated symptoms of joint swelling, right ankle, and right foot pain,. He states that he has not tried any medications for the relief of his symptoms.  He denies hitting his head, LOC, and any other symptoms.  Past Medical History  Diagnosis Date  . HIV infection   . Hypertension    Past Surgical History  Procedure Laterality Date  . Hand surgery     Family History  Problem Relation Age of Onset  . Other Mother     murdered with gunshot  . COPD Maternal Grandmother   . Heart disease Maternal Grandmother   . Other Maternal Grandfather    History  Substance Use Topics  . Smoking status: Current Every Day Smoker    Types: Cigarettes  . Smokeless tobacco: Not on file  . Alcohol Use: 0.0 oz/week    0 Standard drinks or equivalent per week     Comment: beer occassional    Review of Systems  Musculoskeletal: Positive for joint swelling and arthralgias (right knee, right ankle, right foot).  Skin: Negative for color change.  Neurological: Negative for syncope.      Allergies  Review of patient's allergies indicates no known allergies.  Home Medications    Prior to Admission medications   Medication Sig Start Date End Date Taking? Authorizing Provider  cyclobenzaprine (FLEXERIL) 10 MG tablet Take 1 tablet (10 mg total) by mouth 2 (two) times daily as needed for muscle spasms. 07/12/14   Gerald Manson Neva Seat, PA-C  elvitegravir-cobicistat-emtricitabine-tenofovir (GENVOYA) 150-150-200-10 MG TABS tablet Take 1 tablet by mouth daily with breakfast. 02/25/14   Gerald Munson, MD  traMADol (ULTRAM) 50 MG tablet Take 1 tablet (50 mg total) by mouth every 6 (six) hours as needed. 07/12/14   Gerald Pumphrey Neva Seat, PA-C   BP 133/92 mmHg  Pulse 73  Temp(Src) 98.4 F (36.9 C) (Oral)  Resp 18  SpO2 100% Physical Exam  Constitutional: He is oriented to person, place, and time. He appears well-developed and well-nourished. No distress.  HENT:  Head: Normocephalic and atraumatic.  Eyes: EOM are normal.  Neck: Neck supple. No tracheal deviation present.  Cardiovascular: Normal rate.   Pulmonary/Chest: Effort normal. No respiratory distress.  Musculoskeletal:       Right ankle: He exhibits decreased range of motion (due to pain) and swelling. He exhibits no ecchymosis, no deformity, no laceration and normal pulse. Tenderness. Lateral malleolus tenderness found. Achilles tendon normal.  Normal sensation to touch  Neurological: He is alert and oriented to person, place, and time.  Skin: Skin is  warm and dry.  Psychiatric: He has a normal mood and affect. His behavior is normal.  Nursing note and vitals reviewed.   ED Course  Procedures (including critical care time) DIAGNOSTIC STUDIES: Oxygen Saturation is 100% on RA, nl by my interpretation.    COORDINATION OF CARE: 10:56 AM-Discussed treatment plan which includes x-rays with pt at bedside and pt agreed to plan.   Labs Review Labs Reviewed - No data to display  Imaging Review Dg Ankle Complete Right  07/12/2014   CLINICAL DATA:  Initial encounter for roller-skating injury last night. HIV positive.  EXAM:  RIGHT ANKLE - COMPLETE 3+ VIEW  COMPARISON:  None.  FINDINGS: No acute fracture or dislocation. Base of fifth metatarsal and talar dome intact. There may be mild dorsal soft tissue swelling about the midfoot on the lateral view.  IMPRESSION: No acute osseous abnormality.   Electronically Signed   By: Gerald Guzman M.D.   On: 07/12/2014 12:19   Dg Knee Complete 4 Views Right  07/12/2014   CLINICAL DATA:  Initial encounter for roller-skating injury last night. Swollen and painful. HIV positive.  EXAM: RIGHT KNEE - COMPLETE 4+ VIEW  COMPARISON:  None  FINDINGS: No acute fracture or dislocation. No joint effusion. Joint spaces maintained.  IMPRESSION: No acute osseous abnormality.   Electronically Signed   By: Gerald Guzman M.D.   On: 07/12/2014 12:15     EKG Interpretation None      MDM   Final diagnoses:  Knee injury, right, initial encounter  Ankle sprain, right, initial encounter  Fall, initial encounter    xrays are reassuring, pt declined knee immobilizer. Will give knee sleeve and ASO as well as crutches. 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  Follow-up for Ortho given. Ultram and Flexeril Rx for pain  Medications  HYDROcodone-acetaminophen (NORCO/VICODIN) 5-325 MG per tablet 1 tablet (1 tablet Oral Given 07/12/14 1122)  ibuprofen (ADVIL,MOTRIN) tablet 600 mg (600 mg Oral Given 07/12/14 1122)    38 y.o.Gerald Guzman's evaluation in the Emergency Department is complete. It has been determined that no acute conditions requiring further emergency intervention are present at this time. The patient/guardian have been advised of the diagnosis and plan. We have discussed signs and symptoms that warrant return to the ED, such as changes or worsening in symptoms.  Vital signs are stable at discharge. Filed Vitals:   07/12/14 1031  BP: 133/92  Pulse: 73  Temp: 98.4  F (36.9 C)  Resp: 18    Patient/guardian has voiced understanding and agreed to follow-up with the PCP or specialist.   I personally performed the services described in this documentation, which was scribed in my presence. The recorded information has been reviewed and is accurate.    Gerald Pel, PA-C 07/12/14 1233  Gerald Hutching, MD 07/12/14 [phone]

## 2014-07-12 NOTE — ED Notes (Signed)
Pt reports he fell while skating last night . Pt reports his Rt leg bent underneath him. Pt now has swelling and pain to Rt knee, RT ankle, Rt foot.

## 2014-07-12 NOTE — Discharge Instructions (Signed)
Ankle Sprain °An ankle sprain is an injury to the strong, fibrous tissues (ligaments) that hold the bones of your ankle joint together.  °CAUSES °An ankle sprain is usually caused by a fall or by twisting your ankle. Ankle sprains most commonly occur when you step on the outer edge of your foot, and your ankle turns inward. People who participate in sports are more prone to these types of injuries.  °SYMPTOMS  °· Pain in your ankle. The pain may be present at rest or only when you are trying to stand or walk. °· Swelling. °· Bruising. Bruising may develop immediately or within 1 to 2 days after your injury. °· Difficulty standing or walking, particularly when turning corners or changing directions. °DIAGNOSIS  °Your caregiver will ask you details about your injury and perform a physical exam of your ankle to determine if you have an ankle sprain. During the physical exam, your caregiver will press on and apply pressure to specific areas of your foot and ankle. Your caregiver will try to move your ankle in certain ways. An X-ray exam may be done to be sure a bone was not broken or a ligament did not separate from one of the bones in your ankle (avulsion fracture).  °TREATMENT  °Certain types of braces can help stabilize your ankle. Your caregiver can make a recommendation for this. Your caregiver may recommend the use of medicine for pain. If your sprain is severe, your caregiver may refer you to a surgeon who helps to restore function to parts of your skeletal system (orthopedist) or a physical therapist. °HOME CARE INSTRUCTIONS  °· Apply ice to your injury for 1-2 days or as directed by your caregiver. Applying ice helps to reduce inflammation and pain. °· Put ice in a plastic bag. °· Place a towel between your skin and the bag. °· Leave the ice on for 15-20 minutes at a time, every 2 hours while you are awake. °· Only take over-the-counter or prescription medicines for pain, discomfort, or fever as directed by  your caregiver. °· Elevate your injured ankle above the level of your heart as much as possible for 2-3 days. °· If your caregiver recommends crutches, use them as instructed. Gradually put weight on the affected ankle. Continue to use crutches or a cane until you can walk without feeling pain in your ankle. °· If you have a plaster splint, wear the splint as directed by your caregiver. Do not rest it on anything harder than a pillow for the first 24 hours. Do not put weight on it. Do not get it wet. You may take it off to take a shower or bath. °· You may have been given an elastic bandage to wear around your ankle to provide support. If the elastic bandage is too tight (you have numbness or tingling in your foot or your foot becomes cold and blue), adjust the bandage to make it comfortable. °· If you have an air splint, you may blow more air into it or let air out to make it more comfortable. You may take your splint off at night and before taking a shower or bath. Wiggle your toes in the splint several times per day to decrease swelling. °SEEK MEDICAL CARE IF:  °· You have rapidly increasing bruising or swelling. °· Your toes feel extremely cold or you lose feeling in your foot. °· Your pain is not relieved with medicine. °SEEK IMMEDIATE MEDICAL CARE IF: °· Your toes are numb or blue. °·   You have severe pain that is increasing. MAKE SURE YOU:   Understand these instructions.  Will watch your condition.  Will get help right away if you are not doing well or get worse. Document Released: 01/16/2005 Document Revised: 10/11/2011 Document Reviewed: 01/28/2011 [facility] Patient Information 2015 [place], [place]. This information is not intended to replace advice given to you by your health care provider. Make sure you discuss any questions you have with your health care provider.  Knee Effusion The medical term for having fluid in your knee is effusion. This is often due to an internal derangement of the  knee. This means something is wrong inside the knee. Some of the causes of fluid in the knee may be torn cartilage, a torn ligament, or bleeding into the joint from an injury. Your knee is likely more difficult to bend and move. This is often because there is increased pain and pressure in the joint. The time it takes for recovery from a knee effusion depends on different factors, including:   Type of injury.  Your age.  Physical and medical conditions.  Rehabilitation Strategies. How long you will be away from your normal activities will depend on what kind of knee problem you have and how much damage is present. Your knee has two types of cartilage. Articular cartilage covers the bone ends and lets your knee bend and move smoothly. Two menisci, thick pads of cartilage that form a rim inside the joint, help absorb shock and stabilize your knee. Ligaments bind the bones together and support your knee joint. Muscles move the joint, help support your knee, and take stress off the joint itself. CAUSES  Often an effusion in the knee is caused by an injury to one of the menisci. This is often a tear in the cartilage. Recovery after a meniscus injury depends on how much meniscus is damaged and whether you have damaged other knee tissue. Small tears may heal on their own with conservative treatment. Conservative means rest, limited weight bearing activity and muscle strengthening exercises. Your recovery may take up to 6 weeks.  TREATMENT  Larger tears may require surgery. Meniscus injuries may be treated during arthroscopy. Arthroscopy is a procedure in which your surgeon uses a small telescope like instrument to look in your knee. Your caregiver can make a more accurate diagnosis (learning what is wrong) by performing an arthroscopic procedure. If your injury is on the inner margin of the meniscus, your surgeon may trim the meniscus back to a smooth rim. In other cases your surgeon will try to repair a  damaged meniscus with stitches (sutures). This may make rehabilitation take longer, but may provide better long term result by helping your knee keep its shock absorption capabilities. Ligaments which are completely torn usually require surgery for repair. HOME CARE INSTRUCTIONS  Use crutches as instructed.  If a brace is applied, use as directed.  Once you are home, an ice pack applied to your swollen knee may help with discomfort and help decrease swelling.  Keep your knee raised (elevated) when you are not up and around or on crutches.  Only take over-the-counter or prescription medicines for pain, discomfort, or fever as directed by your caregiver.  Your caregivers will help with instructions for rehabilitation of your knee. This often includes strengthening exercises.  You may resume a normal diet and activities as directed. SEEK MEDICAL CARE IF:   There is increased swelling in your knee.  You notice redness, swelling, or increasing pain in  your knee.  An unexplained oral temperature above 102 F (38.9 C) develops. SEEK IMMEDIATE MEDICAL CARE IF:   You develop a rash.  You have difficulty breathing.  You have any allergic reactions from medications you may have been given.  There is severe pain with any motion of the knee. MAKE SURE YOU:   Understand these instructions.  Will watch your condition.  Will get help right away if you are not doing well or get worse. Document Released: 04/08/2003 Document Revised: 04/10/2011 Document Reviewed: 06/12/2007 [facility] Patient Information 2015 [place], [place]. This information is not intended to replace advice given to you by your health care provider. Make sure you discuss any questions you have with your health care provider.  Ligament Sprain Ligaments are tough, fibrous tissues that hold bones together at the joints. A sprain can occur when a ligament is stretched. This injury may take several weeks to heal. HOME CARE  INSTRUCTIONS   Rest the injured area for as long as directed by your caregiver. Then slowly start using the joint as directed by your caregiver and as the pain allows.  Keep the affected joint raised if possible to lessen swelling.  Apply ice for 15-20 minutes to the injured area every couple hours for the first half day, then 03-04 times per day for the first 48 hours. Put the ice in a plastic bag and place a towel between the bag of ice and your skin.  Wear any splinting, casting, or elastic bandage applications as instructed.  Only take over-the-counter or prescription medicines for pain, discomfort, or fever as directed by your caregiver. Do not use aspirin immediately after the injury unless instructed by your caregiver. Aspirin can cause increased bleeding and bruising of the tissues.  If you were given crutches, continue to use them as instructed and do not resume weight bearing on the affected extremity until instructed. SEEK MEDICAL CARE IF:   Your bruising, swelling, or pain increases.  You have cold and numb fingers or toes if your arm or leg was injured. SEEK IMMEDIATE MEDICAL CARE IF:   Your toes are numb or blue if your leg was injured.  Your fingers are numb or blue if your arm was injured.  Your pain is not responding to medicines and continues to stay the same or gets worse. MAKE SURE YOU:   Understand these instructions.  Will watch your condition.  Will get help right away if you are not doing well or get worse. Document Released: 01/14/2000 Document Revised: 04/10/2011 Document Reviewed: 11/12/2007 [facility] Patient Information 2015 [place], [place]. This information is not intended to replace advice given to you by your health care provider. Make sure you discuss any questions you have with your health care provider.

## 2014-07-12 NOTE — ED Notes (Signed)
Declined W/C at D/C and was escorted to lobby by RN. 

## 2014-07-27 ENCOUNTER — Other Ambulatory Visit (INDEPENDENT_AMBULATORY_CARE_PROVIDER_SITE_OTHER): Payer: Self-pay

## 2014-07-27 ENCOUNTER — Other Ambulatory Visit: Payer: Self-pay | Admitting: *Deleted

## 2014-07-27 DIAGNOSIS — B20 Human immunodeficiency virus [HIV] disease: Secondary | ICD-10-CM

## 2014-07-27 LAB — CBC WITH DIFFERENTIAL/PLATELET
BASOS ABS: 0 10*3/uL (ref 0.0–0.1)
Basophils Relative: 0 % (ref 0–1)
Eosinophils Absolute: 0.1 10*3/uL (ref 0.0–0.7)
Eosinophils Relative: 1 % (ref 0–5)
HCT: 41.9 % (ref 39.0–52.0)
HEMOGLOBIN: 14.8 g/dL (ref 13.0–17.0)
LYMPHS ABS: 1.5 10*3/uL (ref 0.7–4.0)
Lymphocytes Relative: 27 % (ref 12–46)
MCH: 32.8 pg (ref 26.0–34.0)
MCHC: 35.3 g/dL (ref 30.0–36.0)
MCV: 92.9 fL (ref 78.0–100.0)
MONO ABS: 0.3 10*3/uL (ref 0.1–1.0)
MPV: 9.9 fL (ref 8.6–12.4)
Monocytes Relative: 5 % (ref 3–12)
NEUTROS ABS: 3.8 10*3/uL (ref 1.7–7.7)
NEUTROS PCT: 67 % (ref 43–77)
Platelets: 242 10*3/uL (ref 150–400)
RBC: 4.51 MIL/uL (ref 4.22–5.81)
RDW: 13 % (ref 11.5–15.5)
WBC: 5.7 10*3/uL (ref 4.0–10.5)

## 2014-07-27 LAB — COMPREHENSIVE METABOLIC PANEL
AST: 21 U/L (ref 0–37)
Albumin: 4.2 g/dL (ref 3.5–5.2)
Alkaline Phosphatase: 71 U/L (ref 39–117)
BILIRUBIN TOTAL: 0.3 mg/dL (ref 0.2–1.2)
BUN: 11 mg/dL (ref 6–23)
CO2: 27 mEq/L (ref 19–32)
CREATININE: 1.36 mg/dL — AB (ref 0.50–1.35)
Calcium: 9.6 mg/dL (ref 8.4–10.5)
Chloride: 105 mEq/L (ref 96–112)
Glucose, Bld: 97 mg/dL (ref 70–99)
Potassium: 5.2 mEq/L (ref 3.5–5.3)
Sodium: 141 mEq/L (ref 135–145)
Total Protein: 7 g/dL (ref 6.0–8.3)

## 2014-07-27 NOTE — Addendum Note (Signed)
Addended by: Mariea ClontsGREEN, Milburn Freeney D on: 07/27/2014 03:23 PM   Modules accepted: Orders

## 2014-07-28 LAB — HIV-1 RNA QUANT-NO REFLEX-BLD: HIV-1 RNA Quant, Log: <1.3 {Log} (ref <1.30)

## 2014-07-28 LAB — T-HELPER CELL (CD4) - (RCID CLINIC ONLY)
CD4 T CELL HELPER: 21 % — AB (ref 33–55)
CD4 T Cell Abs: 370 /uL — ABNORMAL LOW (ref 400–2700)

## 2014-08-12 ENCOUNTER — Encounter: Payer: Self-pay | Admitting: Infectious Disease

## 2014-08-12 ENCOUNTER — Ambulatory Visit (INDEPENDENT_AMBULATORY_CARE_PROVIDER_SITE_OTHER): Payer: Self-pay | Admitting: Infectious Disease

## 2014-08-12 VITALS — BP 134/83 | HR 92 | Temp 98.3°F | Wt 193.0 lb

## 2014-08-12 DIAGNOSIS — F17200 Nicotine dependence, unspecified, uncomplicated: Secondary | ICD-10-CM

## 2014-08-12 DIAGNOSIS — Z720 Tobacco use: Secondary | ICD-10-CM

## 2014-08-12 DIAGNOSIS — B20 Human immunodeficiency virus [HIV] disease: Secondary | ICD-10-CM

## 2014-08-12 DIAGNOSIS — I10 Essential (primary) hypertension: Secondary | ICD-10-CM

## 2014-08-12 MED ORDER — DOXYCYCLINE HYCLATE 100 MG PO TABS: 100.0000 mg | ORAL_TABLET | Freq: Two times a day (BID) | ORAL | Status: DC

## 2014-08-12 NOTE — Progress Notes (Signed)
   Subjective:    Patient ID: Gerald Guzman, male    DOB: [birth year], 39 y.o.   MRN: [id number]  HPI  39 year-old African-American man with history of tobacco use but previously no medical problems who has been recently diagnosed with HIV. His virus has come under nice control with GENVOYA  Lab Results  Component Value Date   HIV1RNAQUANT <20 07/27/2014   And his CD4 count is :  Lab Results  Component Value Date   CD4TABS 370* 07/27/2014   CD4TABS 410 04/28/2014   CD4TABS 320* 02/16/2014    Review of Systems  Constitutional: Negative for fever, chills, diaphoresis, activity change, appetite change, fatigue and unexpected weight change.  HENT: Negative for congestion, rhinorrhea, sinus pressure, sneezing, sore throat and trouble swallowing.   Eyes: Negative for photophobia and visual disturbance.  Respiratory: Negative for cough, chest tightness, shortness of breath, wheezing and stridor.   Cardiovascular: Negative for chest pain, palpitations and leg swelling.  Gastrointestinal: Negative for nausea, vomiting, abdominal pain, diarrhea, constipation, blood in stool, abdominal distention and anal bleeding.  Genitourinary: Negative for dysuria, hematuria, flank pain and difficulty urinating.  Musculoskeletal: Negative for myalgias, back pain, joint swelling, arthralgias and gait problem.  Skin: Negative for color change, pallor, rash and wound.  Neurological: Negative for dizziness, tremors, weakness and light-headedness.  Hematological: Negative for adenopathy. Does not bruise/bleed easily.  Psychiatric/Behavioral: Negative for behavioral problems, confusion, sleep disturbance, dysphoric mood, decreased concentration and agitation.       Objective:   Physical Exam  Constitutional: He is oriented to person, place, and time. He appears well-developed and well-nourished.  HENT:  Head: Normocephalic and atraumatic.  Eyes: Conjunctivae and EOM are normal.  Neck: Normal range of  motion. Neck supple.  Cardiovascular: Normal rate and regular rhythm.   Pulmonary/Chest: Effort normal. No respiratory distress. He has no wheezes.  Abdominal: Soft. He exhibits no distension.  Musculoskeletal: Normal range of motion. He exhibits no edema or tenderness.  Neurological: He is alert and oriented to person, place, and time.  Skin: Skin is warm and dry. No rash noted. No erythema. No pallor.  Psychiatric: He has a normal mood and affect. His behavior is normal. Judgment and thought content normal.  Nursing note and vitals reviewed.         Assessment & Plan:    #1 Continue GENVOYA and renew ADAP  #2 Smoking: cessation counseling provided  #2  HTN: Have some degree of whitecoat hypertension but likely also has baseline hypertension that may need to be addressed with medications

## 2014-08-14 ENCOUNTER — Other Ambulatory Visit: Payer: Self-pay | Admitting: *Deleted

## 2014-08-14 DIAGNOSIS — B20 Human immunodeficiency virus [HIV] disease: Secondary | ICD-10-CM

## 2014-08-14 MED ORDER — ELVITEG-COBIC-EMTRICIT-TENOFAF 150-150-200-10 MG PO TABS: 1.0000 | ORAL_TABLET | Freq: Every day | ORAL | Status: DC

## 2014-10-07 NOTE — Progress Notes (Signed)
Notified [facility] by fax. Neftali Thurow M, RN 

## 2014-10-13 ENCOUNTER — Ambulatory Visit: Payer: Self-pay | Admitting: Infectious Disease

## 2014-10-15 ENCOUNTER — Ambulatory Visit: Payer: Self-pay | Admitting: Infectious Disease

## 2014-11-10 ENCOUNTER — Encounter: Payer: Self-pay | Admitting: Infectious Disease

## 2014-11-10 ENCOUNTER — Ambulatory Visit (INDEPENDENT_AMBULATORY_CARE_PROVIDER_SITE_OTHER): Payer: Self-pay | Admitting: Infectious Disease

## 2014-11-10 VITALS — BP 145/89 | HR 69 | Temp 98.3°F | Ht 75.0 in | Wt 195.0 lb

## 2014-11-10 DIAGNOSIS — B20 Human immunodeficiency virus [HIV] disease: Secondary | ICD-10-CM

## 2014-11-10 DIAGNOSIS — F17200 Nicotine dependence, unspecified, uncomplicated: Secondary | ICD-10-CM

## 2014-11-10 DIAGNOSIS — Z113 Encounter for screening for infections with a predominantly sexual mode of transmission: Secondary | ICD-10-CM

## 2014-11-10 DIAGNOSIS — I10 Essential (primary) hypertension: Secondary | ICD-10-CM

## 2014-11-10 DIAGNOSIS — Z23 Encounter for immunization: Secondary | ICD-10-CM

## 2014-11-10 DIAGNOSIS — Z720 Tobacco use: Secondary | ICD-10-CM

## 2014-11-10 LAB — COMPLETE METABOLIC PANEL WITH GFR
ALT: 6 U/L — ABNORMAL LOW (ref 9–46)
AST: 19 U/L (ref 10–40)
Albumin: 4.4 g/dL (ref 3.6–5.1)
Alkaline Phosphatase: 70 U/L (ref 40–115)
BILIRUBIN TOTAL: 0.5 mg/dL (ref 0.2–1.2)
BUN: 10 mg/dL (ref 7–25)
CHLORIDE: 104 mmol/L (ref 98–110)
CO2: 27 mmol/L (ref 20–31)
Calcium: 9.4 mg/dL (ref 8.6–10.3)
Creat: 1.21 mg/dL (ref 0.60–1.35)
GFR, Est African American: 87 mL/min (ref >=60)
GFR, Est Non African American: 75 mL/min (ref >=60)
Glucose, Bld: 138 mg/dL — ABNORMAL HIGH (ref 65–99)
Potassium: 4.8 mmol/L (ref 3.5–5.3)
Sodium: 138 mmol/L (ref 135–146)
TOTAL PROTEIN: 6.8 g/dL (ref 6.1–8.1)

## 2014-11-10 LAB — CBC WITH DIFFERENTIAL/PLATELET
BASOS ABS: 0 10*3/uL (ref 0.0–0.1)
BASOS PCT: 0 % (ref 0–1)
Eosinophils Absolute: 0.1 10*3/uL (ref 0.0–0.7)
Eosinophils Relative: 1 % (ref 0–5)
HCT: 42 % (ref 39.0–52.0)
Hemoglobin: 14.8 g/dL (ref 13.0–17.0)
LYMPHS ABS: 1.6 10*3/uL (ref 0.7–4.0)
Lymphocytes Relative: 26 % (ref 12–46)
MCH: 32.8 pg (ref 26.0–34.0)
MCHC: 35.2 g/dL (ref 30.0–36.0)
MCV: 93.1 fL (ref 78.0–100.0)
MPV: 10.6 fL (ref 8.6–12.4)
Monocytes Absolute: 0.4 10*3/uL (ref 0.1–1.0)
Monocytes Relative: 6 % (ref 3–12)
Neutro Abs: 4 10*3/uL (ref 1.7–7.7)
Neutrophils Relative %: 67 % (ref 43–77)
PLATELETS: 241 10*3/uL (ref 150–400)
RBC: 4.51 MIL/uL (ref 4.22–5.81)
RDW: 12.9 % (ref 11.5–15.5)
WBC: 6 10*3/uL (ref 4.0–10.5)

## 2014-11-10 MED ORDER — ELVITEG-COBIC-EMTRICIT-TENOFAF 150-150-200-10 MG PO TABS: 1.0000 | ORAL_TABLET | Freq: Every day | ORAL | Status: DC

## 2014-11-10 NOTE — Addendum Note (Signed)
Addended by: Andree Coss on: 11/10/2014 05:32 PM   Modules accepted: Orders

## 2014-11-10 NOTE — Progress Notes (Signed)
Chief complaint: followup for HIV on meds Subjective:    Patient ID: Gerald Guzman, male    DOB: 1975/10/02, 39 y.o.   MRN: [id number]  HPI   39 year-old African-American man with history of tobacco use but previously no medical problems who has been recently diagnosed with HIV. His virus has come under nice control with GENVOYA  Lab Results  Component Value Date   HIV1RNAQUANT <20 07/27/2014   And his CD4 count is :  Lab Results  Component Value Date   CD4TABS 370* 07/27/2014   CD4TABS 410 04/28/2014   CD4TABS 320* 02/16/2014   He has need of hep A, B and flu vaccines today.  Otherwise he is doing well. He has been working hard on preparation for the [employer] visit and speech at [facility].  Past Medical History  Diagnosis Date  . HIV infection (HCC)   . Hypertension     Past Surgical History  Procedure Laterality Date  . Hand surgery      Family History  Problem Relation Age of Onset  . Other Mother     murdered with gunshot  . COPD Maternal Grandmother   . Heart disease Maternal Grandmother   . Other Maternal Grandfather       Social History   Social History  . Marital Status: Single    Spouse Name: N/A  . Number of Children: N/A  . Years of Education: N/A   Social History Main Topics  . Smoking status: Current Every Day Smoker -- 1.00 packs/day    Types: Cigarettes  . Smokeless tobacco: None     Comment: "I'll just stop them when I'm ready, but not yet"  . Alcohol Use: 0.0 oz/week    0 Standard drinks or equivalent per week     Comment: beer occassional  . Drug Use: Yes    Special: Marijuana  . Sexual Activity: Not Currently     Comment: declined condoms   Other Topics Concern  . None   Social History Narrative    No Known Allergies   Current outpatient prescriptions:  .  elvitegravir-cobicistat-emtricitabine-tenofovir (GENVOYA) 150-150-200-10 MG TABS tablet, Take 1 tablet by mouth daily with breakfast.,  Disp: 30 tablet, Rfl: 11   Review of Systems  Constitutional: Negative for fever, chills, diaphoresis, activity change, appetite change, fatigue and unexpected weight change.  HENT: Negative for congestion, rhinorrhea, sinus pressure, sneezing, sore throat and trouble swallowing.   Eyes: Negative for photophobia and visual disturbance.  Respiratory: Negative for cough, chest tightness, shortness of breath, wheezing and stridor.   Cardiovascular: Negative for chest pain, palpitations and leg swelling.  Gastrointestinal: Negative for nausea, vomiting, abdominal pain, diarrhea, constipation, blood in stool, abdominal distention and anal bleeding.  Genitourinary: Negative for dysuria, hematuria, flank pain and difficulty urinating.  Musculoskeletal: Negative for myalgias, back pain, joint swelling, arthralgias and gait problem.  Skin: Negative for color change, pallor, rash and wound.  Neurological: Negative for dizziness, tremors, weakness and light-headedness.  Hematological: Negative for adenopathy. Does not bruise/bleed easily.  Psychiatric/Behavioral: Negative for behavioral problems, confusion, sleep disturbance, dysphoric mood, decreased concentration and agitation.       Objective:   Physical Exam  Constitutional: He is oriented to person, place, and time. He appears well-developed and well-nourished.  HENT:  Head: Normocephalic and atraumatic.  Eyes: Conjunctivae and EOM are normal.  Neck: Normal range of motion. Neck supple.  Cardiovascular: Normal rate and regular rhythm.   Pulmonary/Chest: Effort normal. No respiratory  distress. He has no wheezes.  Abdominal: Soft. He exhibits no distension.  Musculoskeletal: Normal range of motion. He exhibits no edema or tenderness.  Neurological: He is alert and oriented to person, place, and time.  Skin: Skin is warm and dry. No rash noted. No erythema. No pallor.  Psychiatric: He has a normal mood and affect. His behavior is normal.  Judgment and thought content normal.  Nursing note and vitals reviewed.         Assessment & Plan:    #1 Continue GENVOYA, RTC in a few months and renew ADAP again   #2 Smoking: cessation counseling provided, at minimum move to vaporized cigarettes instead of conventional tobacco  #2  HTN: Have some degree of whitecoat hypertension but likely also has baseline hypertension that may need to be addressed with medications

## 2014-11-11 LAB — T-HELPER CELL (CD4) - (RCID CLINIC ONLY)
CD4 % Helper T Cell: 22 % — ABNORMAL LOW (ref 33–55)
CD4 T Cell Abs: 360 /uL — ABNORMAL LOW (ref 400–2700)

## 2014-11-11 LAB — URINE CYTOLOGY ANCILLARY ONLY
CHLAMYDIA, DNA PROBE: NEGATIVE
NEISSERIA GONORRHEA: NEGATIVE

## 2014-11-11 LAB — RPR

## 2014-11-12 LAB — HIV-1 RNA QUANT-NO REFLEX-BLD
HIV 1 RNA Quant: 23 copies/mL — ABNORMAL HIGH (ref <20)
HIV-1 RNA Quant, Log: 1.36 {Log} — ABNORMAL HIGH (ref <1.30)

## 2014-12-31 ENCOUNTER — Telehealth: Payer: Self-pay | Admitting: *Deleted

## 2014-12-31 NOTE — Telephone Encounter (Signed)
Pt going into a "drug rehab program for 90 days."  Will need to have HIV medication while in rehab.  [facility] can [facility] HIV medication to pt at rehab location.  Pt will go to [facility] and supply address and phone number today.

## 2014-12-31 NOTE — Telephone Encounter (Signed)
Very good 

## 2015-01-05 ENCOUNTER — Telehealth: Payer: Self-pay | Admitting: *Deleted

## 2015-01-05 NOTE — Telephone Encounter (Signed)
[facility], patient is going to in-patient for treatment.  He will need a 90 day supply of genvoya.  [facility].   [facility] They are having issues with insurance at this time, will continue to pursue and will call [facility] if any issue. Andree CossHowell, Taran Hable M, RN

## 2015-01-05 NOTE — Telephone Encounter (Signed)
I am fine with 90 day supply

## 2015-02-10 ENCOUNTER — Ambulatory Visit: Payer: Self-pay | Admitting: Infectious Disease

## 2015-04-17 ENCOUNTER — Other Ambulatory Visit: Payer: Self-pay | Admitting: Infectious Disease

## 2015-04-26 ENCOUNTER — Ambulatory Visit: Payer: Self-pay | Admitting: Infectious Disease

## 2015-04-26 ENCOUNTER — Other Ambulatory Visit: Payer: Self-pay | Admitting: *Deleted

## 2015-04-26 DIAGNOSIS — B20 Human immunodeficiency virus [HIV] disease: Secondary | ICD-10-CM

## 2015-04-26 MED ORDER — ELVITEG-COBIC-EMTRICIT-TENOFAF 150-150-200-10 MG PO TABS: 1.0000 | ORAL_TABLET | Freq: Every day | ORAL | Status: DC

## 2015-05-24 ENCOUNTER — Ambulatory Visit (INDEPENDENT_AMBULATORY_CARE_PROVIDER_SITE_OTHER): Payer: Self-pay | Admitting: Infectious Disease

## 2015-05-24 ENCOUNTER — Encounter: Payer: Self-pay | Admitting: Infectious Disease

## 2015-05-24 VITALS — BP 166/109 | HR 76 | Temp 97.8°F | Ht 75.0 in | Wt 202.0 lb

## 2015-05-24 DIAGNOSIS — B20 Human immunodeficiency virus [HIV] disease: Secondary | ICD-10-CM

## 2015-05-24 DIAGNOSIS — Z113 Encounter for screening for infections with a predominantly sexual mode of transmission: Secondary | ICD-10-CM

## 2015-05-24 DIAGNOSIS — F17200 Nicotine dependence, unspecified, uncomplicated: Secondary | ICD-10-CM

## 2015-05-24 DIAGNOSIS — Z720 Tobacco use: Secondary | ICD-10-CM

## 2015-05-24 DIAGNOSIS — Z79899 Other long term (current) drug therapy: Secondary | ICD-10-CM

## 2015-05-24 DIAGNOSIS — I10 Essential (primary) hypertension: Secondary | ICD-10-CM

## 2015-05-24 LAB — CBC WITH DIFFERENTIAL/PLATELET
BASOS ABS: 0 {cells}/uL (ref 0–200)
BASOS PCT: 0 %
EOS ABS: 49 {cells}/uL (ref 15–500)
Eosinophils Relative: 1 %
HEMATOCRIT: 40.6 % (ref 38.5–50.0)
Hemoglobin: 13.6 g/dL (ref 13.2–17.1)
LYMPHS PCT: 36 %
Lymphs Abs: 1764 cells/uL (ref 850–3900)
MCH: 31.7 pg (ref 27.0–33.0)
MCHC: 33.5 g/dL (ref 32.0–36.0)
MCV: 94.6 fL (ref 80.0–100.0)
MONO ABS: 294 {cells}/uL (ref 200–950)
MPV: 10.1 fL (ref 7.5–12.5)
Monocytes Relative: 6 %
NEUTROS PCT: 57 %
Neutro Abs: 2793 cells/uL (ref 1500–7800)
Platelets: 255 10*3/uL (ref 140–400)
RBC: 4.29 MIL/uL (ref 4.20–5.80)
RDW: 14.3 % (ref 11.0–15.0)
WBC: 4.9 10*3/uL (ref 3.8–10.8)

## 2015-05-24 LAB — COMPLETE METABOLIC PANEL WITH GFR
ALT: 6 U/L — AB (ref 9–46)
AST: 19 U/L (ref 10–40)
Albumin: 4.3 g/dL (ref 3.6–5.1)
Alkaline Phosphatase: 64 U/L (ref 40–115)
BUN: 10 mg/dL (ref 7–25)
CHLORIDE: 104 mmol/L (ref 98–110)
CO2: 27 mmol/L (ref 20–31)
CREATININE: 1.2 mg/dL (ref 0.60–1.35)
Calcium: 9.1 mg/dL (ref 8.6–10.3)
GFR, Est African American: 87 mL/min (ref >=60)
GFR, Est Non African American: 75 mL/min (ref >=60)
GLUCOSE: 93 mg/dL (ref 65–99)
Potassium: 4.8 mmol/L (ref 3.5–5.3)
Sodium: 140 mmol/L (ref 135–146)
TOTAL PROTEIN: 6.7 g/dL (ref 6.1–8.1)
Total Bilirubin: 0.5 mg/dL (ref 0.2–1.2)

## 2015-05-24 LAB — LIPID PANEL
Cholesterol: 194 mg/dL (ref 125–200)
HDL: 82 mg/dL (ref >=40)
LDL CALC: 93 mg/dL (ref <130)
Total CHOL/HDL Ratio: 2.4 Ratio (ref <=5.0)
Triglycerides: 97 mg/dL (ref <150)
VLDL: 19 mg/dL (ref <30)

## 2015-05-24 MED ORDER — HYDROCHLOROTHIAZIDE 25 MG PO TABS: 25.0000 mg | ORAL_TABLET | Freq: Every day | ORAL | Status: DC

## 2015-05-24 NOTE — Progress Notes (Signed)
Chief complaint: followup for HIV on meds, but did not renew ADAP Subjective:    Patient ID: Gerald Guzman, male    DOB: 12-18-1975, 40 y.o.   MRN: [id number]  HPI   40 year-old African-American with HIV. His virus has come under nice control with GENVOYA  Lab Results  Component Value Date   HIV1RNAQUANT 23* 11/10/2014   HIV1RNAQUANT <20 07/27/2014   HIV1RNAQUANT 36* 04/28/2014     Lab Results  Component Value Date   CD4TABS 360* 11/10/2014   CD4TABS 370* 07/27/2014   CD4TABS 410 04/28/2014   He has high BP again and we need to start him on antihypertensive. He has not renewed his ADAP so we'll need to do that today and potentially do [employer] application as well.  Past Medical History  Diagnosis Date  . HIV infection (HCC)   . Hypertension     Past Surgical History  Procedure Laterality Date  . Hand surgery      Family History  Problem Relation Age of Onset  . Other Mother     murdered with gunshot  . COPD Maternal Grandmother   . Heart disease Maternal Grandmother   . Other Maternal Grandfather       Social History   Social History  . Marital Status: Single    Spouse Name: N/A  . Number of Children: N/A  . Years of Education: N/A   Social History Main Topics  . Smoking status: Current Every Day Smoker -- 1.00 packs/day    Types: Cigarettes  . Smokeless tobacco: None     Comment: "I'll just stop them when I'm ready, but not yet"  . Alcohol Use: 0.0 oz/week    0 Standard drinks or equivalent per week     Comment: beer occassional  . Drug Use: Yes    Special: Marijuana  . Sexual Activity: Not Currently     Comment: declined condoms   Other Topics Concern  . None   Social History Narrative    No Known Allergies   Current outpatient prescriptions:  .  elvitegravir-cobicistat-emtricitabine-tenofovir (GENVOYA) 150-150-200-10 MG TABS tablet, Take 1 tablet by mouth daily with breakfast., Disp: 30 tablet, Rfl: 1 .  hydrochlorothiazide  (HYDRODIURIL) 25 MG tablet, Take 1 tablet (25 mg total) by mouth daily., Disp: 30 tablet, Rfl: 11   Review of Systems  Constitutional: Negative for fever, chills, diaphoresis, activity change, appetite change, fatigue and unexpected weight change.  HENT: Negative for congestion, rhinorrhea, sinus pressure, sneezing, sore throat and trouble swallowing.   Eyes: Negative for photophobia and visual disturbance.  Respiratory: Negative for cough, chest tightness, shortness of breath, wheezing and stridor.   Cardiovascular: Negative for chest pain, palpitations and leg swelling.  Gastrointestinal: Negative for nausea, vomiting, abdominal pain, diarrhea, constipation, blood in stool, abdominal distention and anal bleeding.  Genitourinary: Negative for dysuria, hematuria, flank pain and difficulty urinating.  Musculoskeletal: Negative for myalgias, back pain, joint swelling, arthralgias and gait problem.  Skin: Negative for color change, pallor, rash and wound.  Neurological: Negative for dizziness, tremors, weakness and light-headedness.  Hematological: Negative for adenopathy. Does not bruise/bleed easily.  Psychiatric/Behavioral: Negative for behavioral problems, confusion, sleep disturbance, dysphoric mood, decreased concentration and agitation.       Objective:   Physical Exam  Constitutional: He is oriented to person, place, and time. He appears well-developed and well-nourished.  HENT:  Head: Normocephalic and atraumatic.  Eyes: Conjunctivae and EOM are normal.  Neck: Normal range of motion. Neck supple.  Cardiovascular: Normal rate and regular rhythm.   Pulmonary/Chest: Effort normal. No respiratory distress. He has no wheezes.  Abdominal: Soft. He exhibits no distension.  Musculoskeletal: Normal range of motion. He exhibits no edema or tenderness.  Neurological: He is alert and oriented to person, place, and time.  Skin: Skin is warm and dry. No rash noted. No erythema. No pallor.    Psychiatric: He has a normal mood and affect. His behavior is normal. Judgment and thought content normal.  Nursing note and vitals reviewed.         Assessment & Plan:    #1 Continue GENVOYA, renew ADAP again, return to clinic in July and renew ADAP again   #2 Smoking: cessation counseling provided, at minimum move to vaporized cigarettes instead of conventional tobacco  #2  HTN: Add Hydrocort thiazide and have nursing visit next 2 weeks  I spent greater than 25 minutes with the patient including greater than 50% of time in face to face counsel of the patient regarding his HIV his smoking and hypertension and in coordination of his care.

## 2015-05-25 LAB — RPR

## 2015-05-25 LAB — URINE CYTOLOGY ANCILLARY ONLY
CHLAMYDIA, DNA PROBE: NEGATIVE
NEISSERIA GONORRHEA: NEGATIVE

## 2015-05-25 LAB — T-HELPER CELL (CD4) - (RCID CLINIC ONLY)
CD4 % Helper T Cell: 26 % — ABNORMAL LOW (ref 33–55)
CD4 T Cell Abs: 490 /uL (ref 400–2700)

## 2015-05-28 LAB — HIV RNA, RTPCR W/R GT (RTI, PI,INT)
HIV 1 RNA Quant: 41 copies/mL — ABNORMAL HIGH
HIV-1 RNA QUANT, LOG: 1.61 {Log_copies}/mL — AB

## 2015-06-07 ENCOUNTER — Encounter: Payer: Self-pay | Admitting: Infectious Disease

## 2015-06-23 ENCOUNTER — Telehealth: Payer: Self-pay | Admitting: *Deleted

## 2015-06-23 NOTE — Telephone Encounter (Signed)
I would recommend trying to continue the HCTZ for now and if ssx dont improve he can come in for pharmacy appt to consider different anti-HTNsive

## 2015-06-23 NOTE — Telephone Encounter (Signed)
Patient came to the clinic today to report that he has been having a slight headache, hot flashes, and dizziness since starting the HCTZ in April. He denies chest pain, shortness of breath or numbness and tingling anywhere. He advised he still smokes cigarettes and eats fried foods daily. Checked his BP and was 141/88 (was 166/109 at 05/24/15 visit) so it has gone down. Advised the patient to try and stop smoking, drink plenty of water, and cut the fried foods and in the mean time I will let the doctor know what his blood pressure is and symptoms are and call him back if he recommends something different.

## 2015-06-24 NOTE — Telephone Encounter (Signed)
Called the patient and left a message for him to call the office to tell him what Dr Daiva EvesVan Dam said.

## 2015-07-31 ENCOUNTER — Other Ambulatory Visit: Payer: Self-pay | Admitting: Infectious Disease

## 2015-07-31 DIAGNOSIS — B20 Human immunodeficiency virus [HIV] disease: Secondary | ICD-10-CM

## 2015-08-10 ENCOUNTER — Other Ambulatory Visit: Payer: Self-pay

## 2015-08-16 ENCOUNTER — Other Ambulatory Visit (INDEPENDENT_AMBULATORY_CARE_PROVIDER_SITE_OTHER): Payer: Self-pay

## 2015-08-16 DIAGNOSIS — B20 Human immunodeficiency virus [HIV] disease: Secondary | ICD-10-CM

## 2015-08-16 DIAGNOSIS — Z113 Encounter for screening for infections with a predominantly sexual mode of transmission: Secondary | ICD-10-CM

## 2015-08-16 DIAGNOSIS — Z79899 Other long term (current) drug therapy: Secondary | ICD-10-CM

## 2015-08-16 LAB — CBC WITH DIFFERENTIAL/PLATELET
BASOS ABS: 0 {cells}/uL (ref 0–200)
BASOS PCT: 0 %
EOS PCT: 1 %
Eosinophils Absolute: 61 cells/uL (ref 15–500)
HCT: 44.2 % (ref 38.5–50.0)
Hemoglobin: 15.2 g/dL (ref 13.2–17.1)
LYMPHS ABS: 2135 {cells}/uL (ref 850–3900)
Lymphocytes Relative: 35 %
MCH: 32.1 pg (ref 27.0–33.0)
MCHC: 34.4 g/dL (ref 32.0–36.0)
MCV: 93.2 fL (ref 80.0–100.0)
MONOS PCT: 8 %
MPV: 9.9 fL (ref 7.5–12.5)
Monocytes Absolute: 488 cells/uL (ref 200–950)
NEUTROS ABS: 3416 {cells}/uL (ref 1500–7800)
Neutrophils Relative %: 56 %
Platelets: 225 10*3/uL (ref 140–400)
RBC: 4.74 MIL/uL (ref 4.20–5.80)
RDW: 13.1 % (ref 11.0–15.0)
WBC: 6.1 10*3/uL (ref 3.8–10.8)

## 2015-08-17 ENCOUNTER — Encounter: Payer: Self-pay | Admitting: Infectious Disease

## 2015-08-17 LAB — COMPLETE METABOLIC PANEL WITH GFR
ALT: 5 U/L — ABNORMAL LOW (ref 9–46)
AST: 21 U/L (ref 10–40)
Albumin: 4.1 g/dL (ref 3.6–5.1)
Alkaline Phosphatase: 75 U/L (ref 40–115)
BUN: 10 mg/dL (ref 7–25)
CALCIUM: 9.3 mg/dL (ref 8.6–10.3)
CHLORIDE: 101 mmol/L (ref 98–110)
CO2: 25 mmol/L (ref 20–31)
Creat: 1.45 mg/dL — ABNORMAL HIGH (ref 0.60–1.35)
GFR, Est African American: 69 mL/min (ref >=60)
GFR, Est Non African American: 60 mL/min (ref >=60)
Glucose, Bld: 85 mg/dL (ref 65–99)
POTASSIUM: 4.3 mmol/L (ref 3.5–5.3)
SODIUM: 141 mmol/L (ref 135–146)
Total Bilirubin: 0.6 mg/dL (ref 0.2–1.2)
Total Protein: 7 g/dL (ref 6.1–8.1)

## 2015-08-17 LAB — LIPID PANEL
CHOL/HDL RATIO: 3.3 ratio (ref <=5.0)
CHOLESTEROL: 234 mg/dL — AB (ref 125–200)
HDL: 71 mg/dL (ref >=40)
LDL CALC: 141 mg/dL — AB (ref <130)
TRIGLYCERIDES: 108 mg/dL (ref <150)
VLDL: 22 mg/dL (ref <30)

## 2015-08-17 LAB — URINE CYTOLOGY ANCILLARY ONLY
CHLAMYDIA, DNA PROBE: NEGATIVE
Neisseria Gonorrhea: NEGATIVE

## 2015-08-17 LAB — MICROALBUMIN / CREATININE URINE RATIO
CREATININE, URINE: 197 mg/dL (ref 20–370)
MICROALB UR: 0.5 mg/dL
Microalb Creat Ratio: 3 mcg/mg creat (ref <30)

## 2015-08-17 LAB — RPR

## 2015-08-17 LAB — HIV-1 RNA QUANT-NO REFLEX-BLD

## 2015-08-17 NOTE — Addendum Note (Signed)
Addended by: Mariea ClontsGREEN, Massimiliano Rohleder D on: 08/17/2015 02:23 PM   Modules accepted: Orders

## 2015-08-18 LAB — T-HELPER CELL (CD4) - (RCID CLINIC ONLY)
CD4 % Helper T Cell: 23 % — ABNORMAL LOW (ref 33–55)
CD4 T Cell Abs: 550 /uL (ref 400–2700)

## 2015-08-25 ENCOUNTER — Ambulatory Visit: Payer: Self-pay | Admitting: Infectious Disease

## 2015-08-25 ENCOUNTER — Telehealth: Payer: Self-pay | Admitting: *Deleted

## 2015-08-25 NOTE — Telephone Encounter (Signed)
Left message for the patient to call for another appointment with Dr. Daiva Eves.

## 2015-09-29 ENCOUNTER — Ambulatory Visit: Payer: Self-pay | Admitting: Infectious Disease

## 2015-10-26 ENCOUNTER — Ambulatory Visit: Payer: Self-pay | Admitting: Infectious Disease

## 2016-01-11 ENCOUNTER — Ambulatory Visit: Payer: Self-pay | Admitting: Infectious Disease

## 2016-01-13 ENCOUNTER — Ambulatory Visit: Payer: Self-pay | Admitting: Infectious Disease

## 2016-01-20 IMAGING — DX DG ANKLE COMPLETE 3+V*R*
3 series · 3 of 3 positions shown · non-contrast
Comparison: None.

CLINICAL DATA: Initial encounter for roller-skating injury last
night. HIV positive.

EXAM:
RIGHT ANKLE - COMPLETE 3+ VIEW

[ankle ap]
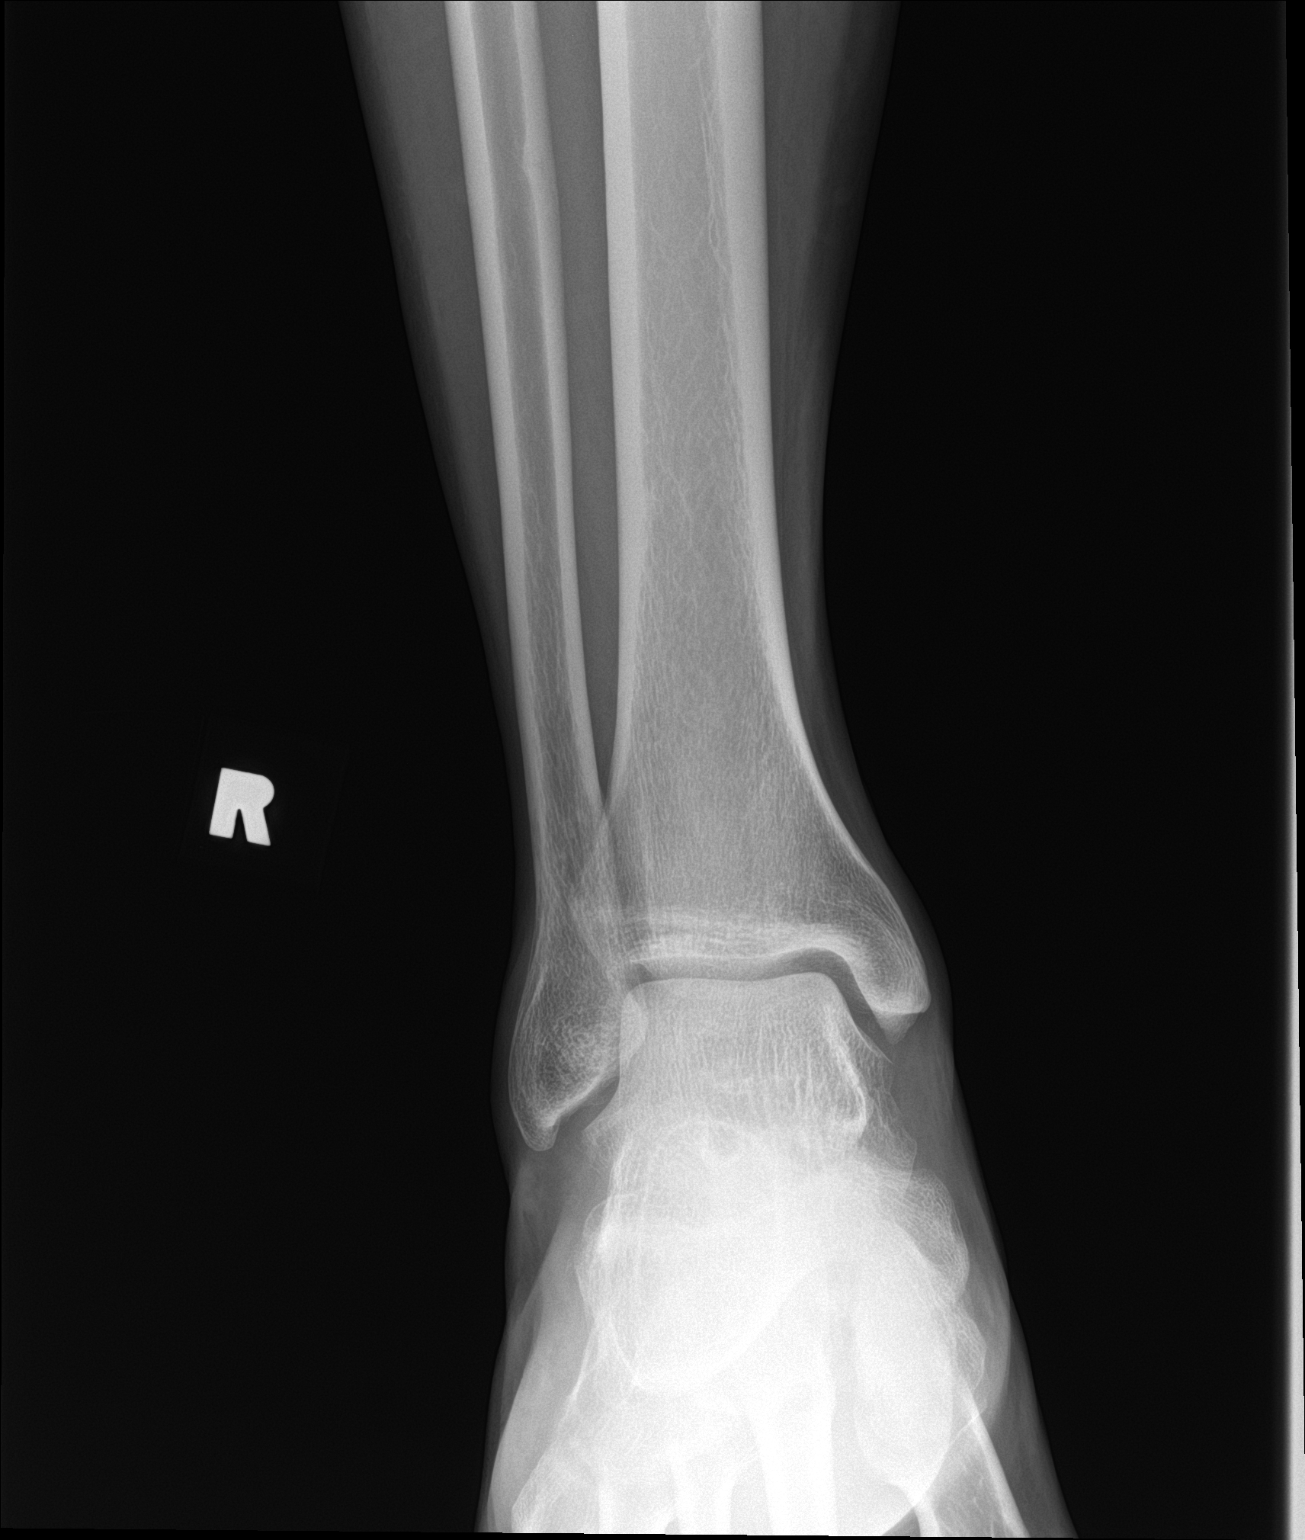

[ankle obl]
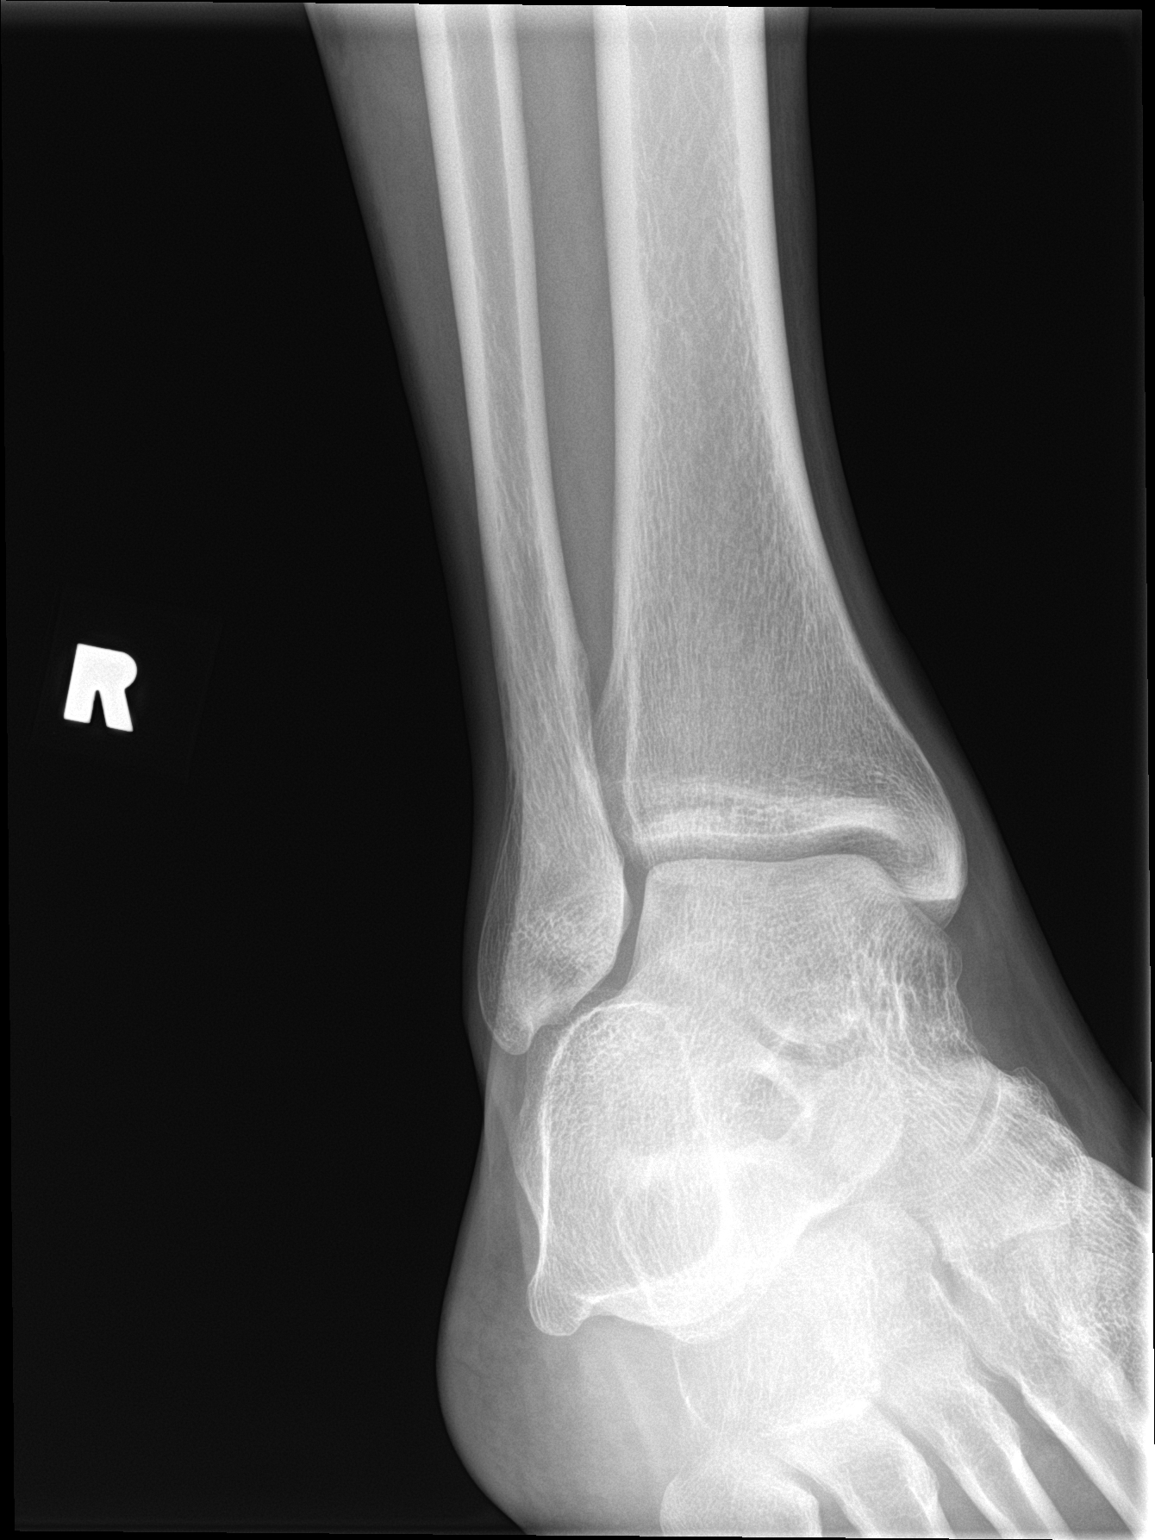

[ankle lat]
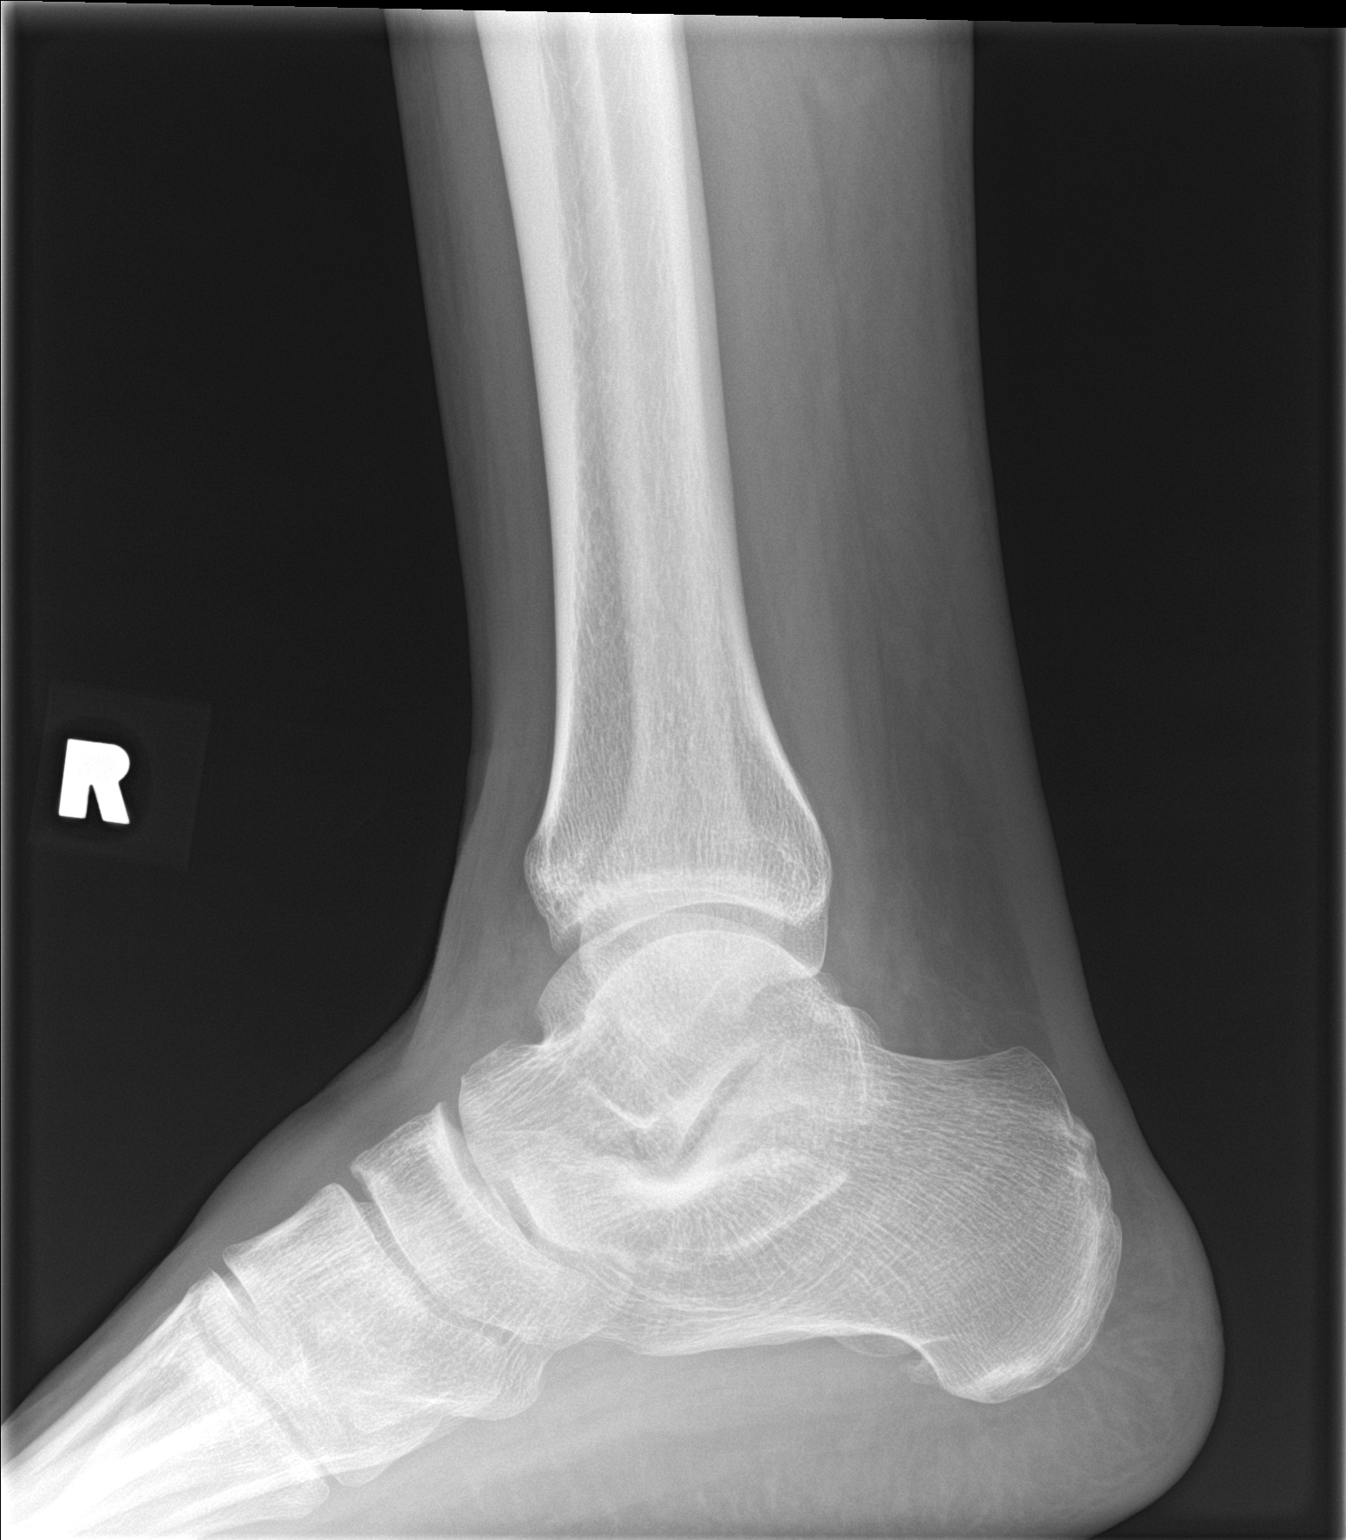

[3 of 3 positions shown; findings below may reference images not displayed]

FINDINGS: No acute fracture or dislocation. Base of fifth metatarsal and talar
dome intact. There may be mild dorsal soft tissue swelling about the
midfoot on the lateral view.
IMPRESSION: No acute osseous abnormality.

## 2016-02-01 ENCOUNTER — Ambulatory Visit (INDEPENDENT_AMBULATORY_CARE_PROVIDER_SITE_OTHER): Payer: Self-pay | Admitting: Infectious Disease

## 2016-02-01 ENCOUNTER — Encounter: Payer: Self-pay | Admitting: Infectious Disease

## 2016-02-01 VITALS — BP 144/94 | HR 84 | Temp 98.6°F | Ht 75.0 in | Wt 207.0 lb

## 2016-02-01 DIAGNOSIS — F17200 Nicotine dependence, unspecified, uncomplicated: Secondary | ICD-10-CM

## 2016-02-01 DIAGNOSIS — F1020 Alcohol dependence, uncomplicated: Secondary | ICD-10-CM

## 2016-02-01 DIAGNOSIS — Z23 Encounter for immunization: Secondary | ICD-10-CM

## 2016-02-01 DIAGNOSIS — I10 Essential (primary) hypertension: Secondary | ICD-10-CM

## 2016-02-01 DIAGNOSIS — Z113 Encounter for screening for infections with a predominantly sexual mode of transmission: Secondary | ICD-10-CM

## 2016-02-01 DIAGNOSIS — B20 Human immunodeficiency virus [HIV] disease: Secondary | ICD-10-CM

## 2016-02-01 HISTORY — DX: Alcohol dependence, uncomplicated: F10.20

## 2016-02-01 LAB — CBC WITH DIFFERENTIAL/PLATELET
BASOS PCT: 0 %
Basophils Absolute: 0 cells/uL (ref 0–200)
EOS ABS: 67 {cells}/uL (ref 15–500)
Eosinophils Relative: 1 %
HCT: 46.1 % (ref 38.5–50.0)
HEMOGLOBIN: 15.6 g/dL (ref 13.2–17.1)
LYMPHS ABS: 2010 {cells}/uL (ref 850–3900)
Lymphocytes Relative: 30 %
MCH: 32.2 pg (ref 27.0–33.0)
MCHC: 33.8 g/dL (ref 32.0–36.0)
MCV: 95.1 fL (ref 80.0–100.0)
MONO ABS: 402 {cells}/uL (ref 200–950)
MPV: 10.3 fL (ref 7.5–12.5)
Monocytes Relative: 6 %
NEUTROS ABS: 4221 {cells}/uL (ref 1500–7800)
Neutrophils Relative %: 63 %
Platelets: 267 10*3/uL (ref 140–400)
RBC: 4.85 MIL/uL (ref 4.20–5.80)
RDW: 13.1 % (ref 11.0–15.0)
WBC: 6.7 10*3/uL (ref 3.8–10.8)

## 2016-02-01 NOTE — Progress Notes (Signed)
Chief complaint: followup for HIV on meds,  Subjective:    Patient ID: Gerald Guzman, male    DOB: 1975-07-06, 41 y.o.   MRN: [id number]  HPI  41 year-old African-American with HIV. His virus has come under nice control with GENVOYA, Though he admits to having missed 4-5 days as in the last month. I told him that this drug is not optimal 1 for patients when they are not highly compliant. We'll check labs today and see how he is doing. He also does admit to drinking 340 ounce cans of 5% beer per day and has done so for more than a decade. He feels is not interfering with his adherence to his ARVS  but rather that stress is what is caused him to be not as  adherent as he was before  Lab Results  Component Value Date   HIV1RNAQUANT <20 08/16/2015   HIV1RNAQUANT 41 (H) 05/24/2015   HIV1RNAQUANT 23 (H) 11/10/2014     Lab Results  Component Value Date   CD4TABS 550 08/16/2015   CD4TABS 490 05/24/2015   CD4TABS 360 (L) 11/10/2014  ell.  Past Medical History:  Diagnosis Date  . HIV infection (HCC)   . Hypertension     Past Surgical History:  Procedure Laterality Date  . HAND SURGERY      Family History  Problem Relation Age of Onset  . Other Mother     murdered with gunshot  . COPD Maternal Grandmother   . Heart disease Maternal Grandmother   . Other Maternal Grandfather       Social History   Social History  . Marital status: Single    Spouse name: N/A  . Number of children: N/A  . Years of education: N/A   Social History Main Topics  . Smoking status: Current Every Day Smoker    Packs/day: 1.00    Types: Cigarettes  . Smokeless tobacco: Never Used     Comment: "I'll just stop them when I'm ready, but not yet"  . Alcohol use 0.0 oz/week     Comment: beer occassional  . Drug use:     Types: Marijuana  . Sexual activity: Not Currently     Comment: declined condoms   Other Topics Concern  . Not on file   Social History Narrative  . No narrative on file      No Known Allergies   Current Outpatient Prescriptions:  Marland Kitchen  GENVOYA 150-150-200-10 MG TABS tablet, TAKE 1 TABLET BY MOUTH DAILY WITH BREAKFAST, Disp: 30 tablet, Rfl: 4 .  hydrochlorothiazide (HYDRODIURIL) 25 MG tablet, Take 1 tablet (25 mg total) by mouth daily., Disp: 30 tablet, Rfl: 11   Review of Systems  Constitutional: Negative for activity change, appetite change, chills, diaphoresis, fatigue, fever and unexpected weight change.  HENT: Negative for congestion, rhinorrhea, sinus pressure, sneezing, sore throat and trouble swallowing.   Eyes: Negative for photophobia and visual disturbance.  Respiratory: Negative for cough, chest tightness, shortness of breath, wheezing and stridor.   Cardiovascular: Negative for chest pain, palpitations and leg swelling.  Gastrointestinal: Negative for abdominal distention, abdominal pain, anal bleeding, blood in stool, constipation, diarrhea, nausea and vomiting.  Genitourinary: Negative for difficulty urinating, dysuria, flank pain and hematuria.  Musculoskeletal: Negative for arthralgias, back pain, gait problem, joint swelling and myalgias.  Skin: Negative for color change, pallor, rash and wound.  Neurological: Negative for dizziness, tremors, weakness and light-headedness.  Hematological: Negative for adenopathy. Does not bruise/bleed easily.  Psychiatric/Behavioral: Negative for  agitation, behavioral problems, confusion, decreased concentration, dysphoric mood and sleep disturbance.       Objective:   Physical Exam  Constitutional: He is oriented to person, place, and time. He appears well-developed and well-nourished.  HENT:  Head: Normocephalic and atraumatic.  Eyes: Conjunctivae and EOM are normal.  Neck: Normal range of motion. Neck supple.  Cardiovascular: Normal rate and regular rhythm.   Pulmonary/Chest: Effort normal. No respiratory distress. He has no wheezes.  Abdominal: Soft. He exhibits no distension.  Musculoskeletal:  Normal range of motion. He exhibits no edema or tenderness.  Neurological: He is alert and oriented to person, place, and time.  Skin: Skin is warm and dry. No rash noted. No erythema. No pallor.  Psychiatric: He has a normal mood and affect. His behavior is normal. Judgment and thought content normal.  Nursing note and vitals reviewed.         Assessment & Plan:    #1 HIV disease: Check labs today I am apprehensive about him being on GENVOYA I think he should be on a drug with a higher barrier to resistance.  ##2  HTN: needs to take his meds  #3 Alcoholism: should try to cut back and would benefit from counseling   I spent greater than 25 minutes with the patient including greater than 50% of time in face to face counsel of the patient regarding his HIV his alcohol use  and hypertension and in coordination of his care.

## 2016-02-01 NOTE — Addendum Note (Signed)
Addended by: Andree CossHOWELL, MICHELLE M on: 02/01/2016 05:35 PM   Modules accepted: Orders

## 2016-02-02 LAB — COMPLETE METABOLIC PANEL WITH GFR
ALBUMIN: 4.4 g/dL (ref 3.6–5.1)
ALT: 7 U/L — ABNORMAL LOW (ref 9–46)
AST: 24 U/L (ref 10–40)
Alkaline Phosphatase: 69 U/L (ref 40–115)
BUN: 9 mg/dL (ref 7–25)
CHLORIDE: 100 mmol/L (ref 98–110)
CO2: 29 mmol/L (ref 20–31)
Calcium: 9.9 mg/dL (ref 8.6–10.3)
Creat: 1.21 mg/dL (ref 0.60–1.35)
GFR, Est African American: 85 mL/min (ref >=60)
GFR, Est Non African American: 74 mL/min (ref >=60)
GLUCOSE: 130 mg/dL — AB (ref 65–99)
POTASSIUM: 4 mmol/L (ref 3.5–5.3)
SODIUM: 140 mmol/L (ref 135–146)
Total Bilirubin: 0.7 mg/dL (ref 0.2–1.2)
Total Protein: 7.4 g/dL (ref 6.1–8.1)

## 2016-02-02 LAB — RPR

## 2016-02-03 LAB — T-HELPER CELL (CD4) - (RCID CLINIC ONLY)
CD4 % Helper T Cell: 28 % — ABNORMAL LOW (ref 33–55)
CD4 T Cell Abs: 530 /uL (ref 400–2700)

## 2016-02-04 LAB — HIV RNA, RTPCR W/R GT (RTI, PI,INT)

## 2016-04-10 ENCOUNTER — Ambulatory Visit: Payer: Self-pay | Admitting: Infectious Disease

## 2016-04-13 ENCOUNTER — Ambulatory Visit: Payer: Self-pay | Admitting: Infectious Disease

## 2016-05-03 ENCOUNTER — Telehealth: Payer: Self-pay | Admitting: *Deleted

## 2016-05-03 NOTE — Telephone Encounter (Signed)
I hope she was also able to [facility] him?

## 2016-05-03 NOTE — Telephone Encounter (Signed)
Symptoms x 2 days.  No Clinic MD appointments available for the rest of the week.  RN advised patient to start the BRAT diet, increase clear liquids, stop ETOH intake, restart ART and B/P medications.  If diarrhea increases patient may start OTC Imodium per the package instructions.  If symptoms do not improve then call [facility] back after 5 days.  Patient verbalized understanding.  The patient currently only has Juanell Fairly to cover [facility] treatment.  Patient cancelled his last 2 appointments with Dr. Daiva Eves in March.  Next appt is 05/29/16 with Dr. Daiva Eves.  Patient needed to bring in 2017 pay stub to [employer] to complete [employer] application in January.  Application is more than 36 days old and needs to be be redone per [employer].  Patient taken to [employer] to complete a new application.  Patient stated that he would bring in the pay stub.

## 2016-05-10 ENCOUNTER — Other Ambulatory Visit: Payer: Self-pay | Admitting: *Deleted

## 2016-05-10 DIAGNOSIS — B20 Human immunodeficiency virus [HIV] disease: Secondary | ICD-10-CM

## 2016-05-10 MED ORDER — ELVITEG-COBIC-EMTRICIT-TENOFAF 150-150-200-10 MG PO TABS: 1.0000 | ORAL_TABLET | Freq: Every day | ORAL | 5 refills | Status: DC

## 2016-05-29 ENCOUNTER — Encounter: Payer: Self-pay | Admitting: Infectious Disease

## 2016-05-29 ENCOUNTER — Ambulatory Visit (INDEPENDENT_AMBULATORY_CARE_PROVIDER_SITE_OTHER): Payer: Self-pay | Admitting: Infectious Disease

## 2016-05-29 VITALS — BP 141/97 | HR 98 | Temp 98.7°F | Ht 75.0 in | Wt 199.0 lb

## 2016-05-29 DIAGNOSIS — F1020 Alcohol dependence, uncomplicated: Secondary | ICD-10-CM

## 2016-05-29 DIAGNOSIS — B20 Human immunodeficiency virus [HIV] disease: Secondary | ICD-10-CM

## 2016-05-29 DIAGNOSIS — Z113 Encounter for screening for infections with a predominantly sexual mode of transmission: Secondary | ICD-10-CM

## 2016-05-29 DIAGNOSIS — I10 Essential (primary) hypertension: Secondary | ICD-10-CM

## 2016-05-29 LAB — COMPREHENSIVE METABOLIC PANEL
AG RATIO: 1.4 ratio (ref 1.0–2.5)
ALBUMIN: 4.4 g/dL (ref 3.6–5.1)
ALT: 7 U/L — ABNORMAL LOW (ref 9–46)
AST: 18 U/L (ref 10–40)
Alkaline Phosphatase: 74 U/L (ref 40–115)
BILIRUBIN TOTAL: 0.5 mg/dL (ref 0.2–1.2)
BUN / CREAT RATIO: 8.1 ratio (ref 6–22)
BUN: 11 mg/dL (ref 7–25)
CO2: 28 mmol/L (ref 20–31)
Calcium: 9.4 mg/dL (ref 8.6–10.3)
Chloride: 104 mmol/L (ref 98–110)
Creat: 1.35 mg/dL (ref 0.60–1.35)
GFR, EST AFRICAN AMERICAN: 75 mL/min (ref >=60)
GFR, Est Non African American: 65 mL/min (ref >=60)
Globulin: 3.1 g/dL (ref 1.9–3.7)
Glucose, Bld: 91 mg/dL (ref 65–99)
POTASSIUM: 4 mmol/L (ref 3.5–5.3)
SODIUM: 140 mmol/L (ref 135–146)
Total Protein: 7.5 g/dL (ref 6.1–8.1)

## 2016-05-29 LAB — CMP 10231
AG RATIO: 1.4 ratio (ref 1.0–2.5)
ALK PHOS: 74 U/L (ref 40–115)
ALT: 7 U/L — ABNORMAL LOW (ref 9–46)
AST: 18 U/L (ref 10–40)
Albumin: 4.4 g/dL (ref 3.6–5.1)
BILIRUBIN TOTAL: 0.5 mg/dL (ref 0.2–1.2)
BUN/Creatinine Ratio: 8.1 Ratio (ref 6–22)
BUN: 11 mg/dL (ref 7–25)
CO2: 28 mmol/L (ref 20–31)
Calcium: 9.4 mg/dL (ref 8.6–10.3)
Chloride: 104 mmol/L (ref 98–110)
Creat: 1.35 mg/dL (ref 0.60–1.35)
GFR, EST NON AFRICAN AMERICAN: 65 mL/min (ref >=60)
GFR, Est African American: 75 mL/min (ref >=60)
GLOBULIN: 3.1 g/dL (ref 1.9–3.7)
GLUCOSE: 91 mg/dL (ref 65–99)
Potassium: 4 mmol/L (ref 3.5–5.3)
Sodium: 140 mmol/L (ref 135–146)
TOTAL PROTEIN: 7.5 g/dL (ref 6.1–8.1)

## 2016-05-29 LAB — CBC WITH DIFFERENTIAL/PLATELET
BASOS ABS: 0 {cells}/uL (ref 0–200)
Basophils Relative: 0 %
EOS PCT: 1 %
Eosinophils Absolute: 63 cells/uL (ref 15–500)
HCT: 43.4 % (ref 38.5–50.0)
Hemoglobin: 15.2 g/dL (ref 13.2–17.1)
Lymphocytes Relative: 39 %
Lymphs Abs: 2457 cells/uL (ref 850–3900)
MCH: 33 pg (ref 27.0–33.0)
MCHC: 35 g/dL (ref 32.0–36.0)
MCV: 94.1 fL (ref 80.0–100.0)
MONOS PCT: 6 %
MPV: 9.7 fL (ref 7.5–12.5)
Monocytes Absolute: 378 cells/uL (ref 200–950)
Neutro Abs: 3402 cells/uL (ref 1500–7800)
Neutrophils Relative %: 54 %
Platelets: 259 10*3/uL (ref 140–400)
RBC: 4.61 MIL/uL (ref 4.20–5.80)
RDW: 13.3 % (ref 11.0–15.0)
WBC: 6.3 10*3/uL (ref 3.8–10.8)

## 2016-05-29 NOTE — Patient Instructions (Signed)
We will make sure that you get your ADAP and you likely also will need [facility]  I would like you to make an appt with [facility] in one month

## 2016-05-29 NOTE — Progress Notes (Signed)
Chief complaint: followup for HIV on meds,  Subjective:    Patient ID: Gerald Guzman, male    DOB: [birth year], 41 y.o.   MRN: [id number]  HPI  41 year-old African-American with HIV. His virus has come under nice control with GENVOYA, Though he admits to having missed 4-5 days several months ago.   He did bring a pay stub and I hope he is going to be able to do ADAP and [employer].    Lab Results  Component Value Date   HIV1RNAQUANT <20 08/16/2015   HIV1RNAQUANT 41 (H) 05/24/2015   HIV1RNAQUANT 23 (H) 11/10/2014     Lab Results  Component Value Date   CD4TABS 530 02/01/2016   CD4TABS 550 08/16/2015   CD4TABS 490 05/24/2015  ell.  Past Medical History:  Diagnosis Date  . Alcoholism (HCC) 02/01/2016  . HIV infection (HCC)   . Hypertension     Past Surgical History:  Procedure Laterality Date  . HAND SURGERY      Family History  Problem Relation Age of Onset  . Other Mother     murdered with gunshot  . COPD Maternal Grandmother   . Heart disease Maternal Grandmother   . Other Maternal Grandfather       Social History   Social History  . Marital status: Single    Spouse name: N/A  . Number of children: N/A  . Years of education: N/A   Social History Main Topics  . Smoking status: Current Every Day Smoker    Packs/day: 1.00    Types: Cigarettes  . Smokeless tobacco: Never Used     Comment: "I'll just stop them when I'm ready, but not yet"  . Alcohol use 0.0 oz/week     Comment: 2-3 40 oz beers a day  . Drug use: Yes    Types: Marijuana  . Sexual activity: Not Currently    Partners: Female    Birth control/ protection: Condom     Comment: declined condoms   Other Topics Concern  . None   Social History Narrative  . None    No Known Allergies   Current Outpatient Prescriptions:  .  elvitegravir-cobicistat-emtricitabine-tenofovir (GENVOYA) 150-150-200-10 MG TABS tablet, Take 1 tablet by mouth daily with breakfast., Disp: 30 tablet, Rfl:  5 .  hydrochlorothiazide (HYDRODIURIL) 25 MG tablet, Take 1 tablet (25 mg total) by mouth daily., Disp: 30 tablet, Rfl: 11   Review of Systems  Constitutional: Negative for activity change, appetite change, chills, diaphoresis, fatigue, fever and unexpected weight change.  HENT: Negative for congestion, rhinorrhea, sinus pressure, sneezing, sore throat and trouble swallowing.   Eyes: Negative for photophobia and visual disturbance.  Respiratory: Negative for cough, chest tightness, shortness of breath, wheezing and stridor.   Cardiovascular: Negative for chest pain, palpitations and leg swelling.  Gastrointestinal: Negative for abdominal distention, abdominal pain, anal bleeding, blood in stool, constipation, diarrhea, nausea and vomiting.  Genitourinary: Negative for difficulty urinating, dysuria, flank pain and hematuria.  Musculoskeletal: Negative for arthralgias, back pain, gait problem, joint swelling and myalgias.  Skin: Negative for color change, pallor, rash and wound.  Neurological: Negative for dizziness, tremors, weakness and light-headedness.  Hematological: Negative for adenopathy. Does not bruise/bleed easily.  Psychiatric/Behavioral: Negative for agitation, behavioral problems, confusion, decreased concentration, dysphoric mood and sleep disturbance.       Objective:   Physical Exam  Constitutional: He is oriented to person, place, and time. He appears well-developed and well-nourished.  HENT:  Head: Normocephalic and atraumatic.  Eyes: Conjunctivae and EOM are normal.  Neck: Normal range of motion. Neck supple.  Cardiovascular: Normal rate and regular rhythm.   Pulmonary/Chest: Effort normal. No respiratory distress. He has no wheezes.  Abdominal: Soft. He exhibits no distension.  Musculoskeletal: Normal range of motion. He exhibits no edema or tenderness.  Neurological: He is alert and oriented to person, place, and time.  Skin: Skin is warm and dry. No rash noted. No  erythema. No pallor.  Psychiatric: He has a normal mood and affect. His behavior is normal. Judgment and thought content normal. His speech is delayed.  Nursing note and vitals reviewed.         Assessment & Plan:    #1 HIV disease: I remain concerned lower barrier to R with GENVOYA I think he should be on a drug with a higher barrier to resistance such as BIKTARVY which would be also cleaner with drug drug interactions.   ##2  HTN: needs to take his meds reliably  Vitals:   05/29/16 1357  BP: (!) 141/97  Pulse: 98  Temp: 98.7 F (37.1 C)     #3 Alcoholism: would benefit from counseling   I spent greater than 25 minutes with the patient including greater than 50% of time in face to face counsel of the patient regarding his HIV his alcohol use  and hypertension and in coordination of his care.

## 2016-05-30 LAB — RPR

## 2016-05-30 LAB — URINE CYTOLOGY ANCILLARY ONLY
Chlamydia: NEGATIVE
NEISSERIA GONORRHEA: NEGATIVE

## 2016-05-30 NOTE — Addendum Note (Signed)
Addended by: Mariea Clonts D on: 05/30/2016 04:11 PM   Modules accepted: Orders

## 2016-05-31 LAB — T-HELPER CELL (CD4) - (RCID CLINIC ONLY)
CD4 % Helper T Cell: 22 % — ABNORMAL LOW (ref 33–55)
CD4 T Cell Abs: 540 /uL (ref 400–2700)

## 2016-06-02 LAB — HIV RNA, RTPCR W/R GT (RTI, PI,INT)
HIV-1 RNA, QN PCR: <1.3 Log copies/mL
HIV-1 RNA, QN PCR: DETECTED {copies}/mL

## 2016-06-21 ENCOUNTER — Encounter: Payer: Self-pay | Admitting: Infectious Disease

## 2016-07-16 ENCOUNTER — Other Ambulatory Visit: Payer: Self-pay | Admitting: Infectious Disease

## 2016-07-16 DIAGNOSIS — I10 Essential (primary) hypertension: Secondary | ICD-10-CM

## 2016-08-11 ENCOUNTER — Other Ambulatory Visit: Payer: Self-pay | Admitting: Infectious Disease

## 2016-08-11 DIAGNOSIS — I10 Essential (primary) hypertension: Secondary | ICD-10-CM

## 2016-10-10 ENCOUNTER — Ambulatory Visit (INDEPENDENT_AMBULATORY_CARE_PROVIDER_SITE_OTHER): Payer: Self-pay | Admitting: Infectious Disease

## 2016-10-10 VITALS — BP 134/88 | HR 83 | Temp 97.7°F | Wt 197.0 lb

## 2016-10-10 DIAGNOSIS — B20 Human immunodeficiency virus [HIV] disease: Secondary | ICD-10-CM

## 2016-10-10 DIAGNOSIS — Z9119 Patient's noncompliance with other medical treatment and regimen: Secondary | ICD-10-CM

## 2016-10-10 DIAGNOSIS — Z91199 Patient's noncompliance with other medical treatment and regimen due to unspecified reason: Secondary | ICD-10-CM

## 2016-10-10 DIAGNOSIS — F1020 Alcohol dependence, uncomplicated: Secondary | ICD-10-CM

## 2016-10-10 DIAGNOSIS — Z23 Encounter for immunization: Secondary | ICD-10-CM

## 2016-10-10 MED ORDER — BICTEGRAVIR-EMTRICITAB-TENOFOV 50-200-25 MG PO TABS: 1.0000 | ORAL_TABLET | Freq: Every day | ORAL | 3 refills | Status: DC

## 2016-10-10 NOTE — Patient Instructions (Signed)
I AM SWITCHING YOU TO BIKTARVY TODAY  YOU NEED TO MEET WITH KATHY WITH PAY [employer] AS SOON AS POSSIBLE (TOMORROW)  WE WILL THEN FILL OUT ADAP PAPERWORK AND  PHARMACEUTICAL ASSITANCE SO THAT YOU CAN CONTINUE THE BIKTARVY (YOUR NEW MEDICINE)  I WANT YOU TO MEET WITH [facility] IN NEXT 2 WEEKS TO SEE HOW WELL YOU ARE DOING ON YOUR MEDS AND RECHECK LABS

## 2016-10-10 NOTE — Progress Notes (Signed)
HPI: Warner Mccreedynthony T Rozycki is a 41 y.o. male who presents to the [facility] today to follow-up with Dr. Daiva EvesVan Dam for his HIV infection.   Allergies: No Known Allergies  Past Medical History: Past Medical History:  Diagnosis Date  . Alcoholism (HCC) 02/01/2016  . HIV infection (HCC)   . Hypertension     Social History: Social History   Social History  . Marital status: Single    Spouse name: N/A  . Number of children: N/A  . Years of education: N/A   Social History Main Topics  . Smoking status: Current Every Day Smoker    Packs/day: 1.00    Types: Cigarettes  . Smokeless tobacco: Never Used     Comment: "I'll just stop them when I'm ready, but not yet"  . Alcohol use 0.0 oz/week     Comment: 2-3 40 oz beers a day  . Drug use: Yes    Types: Marijuana  . Sexual activity: Not Currently    Partners: Female    Birth control/ protection: Condom     Comment: declined condoms   Other Topics Concern  . Not on file   Social History Narrative  . No narrative on file    Current Regimen: Genvoya (currently off)  Labs: HIV 1 RNA Quant (copies/mL)  Date Value  08/16/2015 <20  05/24/2015 41 (H)  11/10/2014 23 (H)   CD4 T Cell Abs (/uL)  Date Value  05/30/2016 540  02/01/2016 530  08/16/2015 550   Hep B S Ab (no units)  Date Value  01/01/2014 NEG   Hepatitis B Surface Ag (no units)  Date Value  01/01/2014 NEGATIVE   HCV Ab (no units)  Date Value  01/01/2014 NEGATIVE    CrCl: CrCl cannot be calculated (Patient's most recent lab result is older than the maximum 21 days allowed.).  Lipids:    Component Value Date/Time   CHOL 234 (H) 08/16/2015 0912   TRIG 108 08/16/2015 0912   HDL 71 08/16/2015 0912   CHOLHDL 3.3 08/16/2015 0912   VLDL 22 08/16/2015 0912   LDLCALC 141 (H) 08/16/2015 0912    Assessment: Ethelene Brownsnthony is here today to follow-up for his HIV infection. He has been maintained on Genvoya but has recently stopped due to life issues and stressors.  He states he  is ready to get back on medications.  Dr[facility] Da[facility]Van Dam [facility].  I spent some time going over [facility] and the importance of continuing on HIV medications and not stopping again.  Went over how to take [employer] and possible side effects. His ADAP is active until 9/30 but he has not renewed with Olegario MessierKathy yet.  He will sign paperwork today and bring in his paystubs later this week.  I will have him come back in 2 weeks to do patient assistance with Biktarvy through [employer] to bridge him until he can get approval with ADAP. I will also get labs at that time.   Plans: - Stop Genvoya completely - Start Biktarvy PO once daily - F/u with me 9/25 at 11am  Cassie L. Kuppelweiser, PharmD, CPP Infectious Diseases Clinical Pharmacist [facility] 10/10/2016, 3:44 PM

## 2016-10-10 NOTE — Progress Notes (Signed)
Chief complaint: followup for HIV NOT taking meds reliably Subjective:    Patient ID: Gerald Guzman, male    DOB: [birth year], 41 y.o.   MRN: [id number]  HPI  41 year-old African-American with HIV. His virus had come under nice control with GENVOYA. At past appt he  admits to having missed 4-5 days  He renewed ADAP in Spring but completely forgot to do so now not realizing that it is bi-annual during specific periods.  Additionally he admits to taking his ARV haphazardly and then not at all x several months.  He denies alcoholism is contributing to this but his girlfriend disagrees.     Lab Results  Component Value Date   HIV1RNAQUANT <20 08/16/2015   HIV1RNAQUANT 41 (H) 05/24/2015   HIV1RNAQUANT 23 (H) 11/10/2014     Lab Results  Component Value Date   CD4TABS 540 05/30/2016   CD4TABS 530 02/01/2016   CD4TABS 550 08/16/2015  ell.  Past Medical History:  Diagnosis Date  . Alcoholism (HCC) 02/01/2016  . HIV infection (HCC)   . Hypertension     Past Surgical History:  Procedure Laterality Date  . HAND SURGERY      Family History  Problem Relation Age of Onset  . Other Mother        murdered with gunshot  . COPD Maternal Grandmother   . Heart disease Maternal Grandmother   . Other Maternal Grandfather       Social History   Social History  . Marital status: Single    Spouse name: N/A  . Number of children: N/A  . Years of education: N/A   Social History Main Topics  . Smoking status: Current Every Day Smoker    Packs/day: 1.00    Types: Cigarettes  . Smokeless tobacco: Never Used     Comment: "I'll just stop them when I'm ready, but not yet"  . Alcohol use 0.0 oz/week     Comment: 2-3 40 oz beers a day  . Drug use: Yes    Types: Marijuana  . Sexual activity: Not Currently    Partners: Female    Birth control/ protection: Condom     Comment: declined condoms   Other Topics Concern  . Not on file   Social History Narrative  . No narrative  on file    No Known Allergies   Current Outpatient Prescriptions:  .  elvitegravir-cobicistat-emtricitabine-tenofovir (GENVOYA) 150-150-200-10 MG TABS tablet, Take 1 tablet by mouth daily with breakfast., Disp: 30 tablet, Rfl: 5 .  hydrochlorothiazide (HYDRODIURIL) 25 MG tablet, TAKE 1 TABLET(25 MG) BY MOUTH DAILY, Disp: 30 tablet, Rfl: 5   Review of Systems  Constitutional: Negative for activity change, appetite change, chills, diaphoresis, fatigue, fever and unexpected weight change.  HENT: Negative for congestion, rhinorrhea, sinus pressure, sneezing, sore throat and trouble swallowing.   Eyes: Negative for photophobia and visual disturbance.  Respiratory: Negative for cough, chest tightness, shortness of breath, wheezing and stridor.   Cardiovascular: Negative for chest pain, palpitations and leg swelling.  Gastrointestinal: Negative for abdominal distention, abdominal pain, anal bleeding, blood in stool, constipation, diarrhea, nausea and vomiting.  Genitourinary: Negative for difficulty urinating, dysuria, flank pain and hematuria.  Musculoskeletal: Negative for arthralgias, back pain, gait problem, joint swelling and myalgias.  Skin: Negative for color change and pallor.  Neurological: Negative for dizziness, tremors, weakness and light-headedness.  Hematological: Negative for adenopathy. Does not bruise/bleed easily.  Psychiatric/Behavioral: Negative for agitation, behavioral problems, confusion, decreased concentration, dysphoric mood and sleep  disturbance.       Objective:   Physical Exam  Constitutional: He is oriented to person, place, and time. He appears well-developed and well-nourished.  HENT:  Head: Normocephalic and atraumatic.  Eyes: Conjunctivae and EOM are normal.  Neck: Normal range of motion. Neck supple.  Cardiovascular: Normal rate and regular rhythm.   Pulmonary/Chest: Effort normal. No respiratory distress. He has no wheezes.  Abdominal: Soft. He  exhibits no distension.  Musculoskeletal: Normal range of motion. He exhibits no edema or tenderness.  Neurological: He is alert and oriented to person, place, and time.  Skin: Skin is warm and dry. No rash noted. No erythema. No pallor.  Psychiatric: He has a normal mood and affect. His behavior is normal. Judgment and thought content normal. His speech is delayed.  Nursing note and vitals reviewed.         Assessment & Plan:    #1 HIV disease: I am VERY concerned that he may have failed with resistance.. But comfortable with initial switch to [facility] pending data  Get labs today, RENEW ADAP and pharma assistance. HOPE he does not have INSTI R. Hopefully at most at 184V but we will see. May need to add a PI in potentially we will see  ##2  HTN: we will address once he gets on top of his HIV There were no vitals filed for this visit.   #3 Alcoholism: denies but clearly a problem   We spent greater than 40  minutes with the patient including greater than 50% of time in face to face counsel of the patient regarding his HIV , his poor adherence, his alcohol use   and in coordination of his care.

## 2016-10-11 LAB — CBC WITH DIFFERENTIAL/PLATELET
BASOS PCT: 0.4 %
Basophils Absolute: 29 cells/uL (ref 0–200)
EOS ABS: 22 {cells}/uL (ref 15–500)
EOS PCT: 0.3 %
HCT: 44.6 % (ref 38.5–50.0)
HEMOGLOBIN: 15.6 g/dL (ref 13.2–17.1)
LYMPHS ABS: 2354 {cells}/uL (ref 850–3900)
MCH: 32.3 pg (ref 27.0–33.0)
MCHC: 35 g/dL (ref 32.0–36.0)
MCV: 92.3 fL (ref 80.0–100.0)
MONOS PCT: 6.6 %
MPV: 10.5 fL (ref 7.5–12.5)
Neutro Abs: 4320 cells/uL (ref 1500–7800)
Neutrophils Relative %: 60 %
Platelets: 248 10*3/uL (ref 140–400)
RBC: 4.83 10*6/uL (ref 4.20–5.80)
RDW: 12 % (ref 11.0–15.0)
Total Lymphocyte: 32.7 %
WBC: 7.2 10*3/uL (ref 3.8–10.8)
WBCMIX: 475 {cells}/uL (ref 200–950)

## 2016-10-11 LAB — COMPLETE METABOLIC PANEL WITH GFR
AG Ratio: 1.4 (calc) (ref 1.0–2.5)
ALBUMIN MSPROF: 4.2 g/dL (ref 3.6–5.1)
ALKALINE PHOSPHATASE (APISO): 85 U/L (ref 40–115)
ALT: 4 U/L — ABNORMAL LOW (ref 9–46)
AST: 17 U/L (ref 10–40)
BUN: 7 mg/dL (ref 7–25)
CALCIUM: 9.4 mg/dL (ref 8.6–10.3)
CO2: 31 mmol/L (ref 20–32)
CREATININE: 1.26 mg/dL (ref 0.60–1.35)
Chloride: 100 mmol/L (ref 98–110)
GFR, EST AFRICAN AMERICAN: 82 mL/min/{1.73_m2} (ref >=60)
GFR, EST NON AFRICAN AMERICAN: 70 mL/min/{1.73_m2} (ref >=60)
Globulin: 2.9 g/dL (calc) (ref 1.9–3.7)
Glucose, Bld: 87 mg/dL (ref 65–99)
Potassium: 4.2 mmol/L (ref 3.5–5.3)
SODIUM: 138 mmol/L (ref 135–146)
Total Bilirubin: 0.6 mg/dL (ref 0.2–1.2)
Total Protein: 7.1 g/dL (ref 6.1–8.1)

## 2016-10-11 LAB — LIPID PANEL
CHOLESTEROL: 201 mg/dL — AB (ref <200)
HDL: 61 mg/dL (ref >40)
LDL CHOLESTEROL (CALC): 111 mg/dL — AB
Non-HDL Cholesterol (Calc): 140 mg/dL (calc) — ABNORMAL HIGH (ref <130)
Total CHOL/HDL Ratio: 3.3 (calc) (ref <5.0)
Triglycerides: 171 mg/dL — ABNORMAL HIGH (ref <150)

## 2016-10-11 LAB — RPR TITER: RPR Titer: 1:1 {titer} — ABNORMAL HIGH

## 2016-10-11 LAB — FLUORESCENT TREPONEMAL AB(FTA)-IGG-BLD: FLUORESCENT TREPONEMAL ABS: REACTIVE — AB

## 2016-10-11 LAB — RPR: RPR Ser Ql: REACTIVE — AB

## 2016-10-12 LAB — T-HELPER CELL (CD4) - (RCID CLINIC ONLY)
CD4 T CELL HELPER: 32 % — AB (ref 33–55)
CD4 T Cell Abs: 820 /uL (ref 400–2700)

## 2016-10-15 LAB — HIV RNA, RTPCR W/R GT (RTI, PI,INT)
HIV 1 RNA QUANT: NOT DETECTED {copies}/mL
HIV-1 RNA QUANT, LOG: NOT DETECTED {Log_copies}/mL

## 2016-10-24 ENCOUNTER — Ambulatory Visit: Payer: Self-pay

## 2016-10-30 ENCOUNTER — Ambulatory Visit: Payer: Self-pay

## 2016-11-29 ENCOUNTER — Emergency Department (HOSPITAL_COMMUNITY)
Admission: EM | Admit: 2016-11-29 | Discharge: 2016-11-29 | Disposition: A | Discharge status: Home / Self Care | Payer: Self-pay | Attending: Emergency Medicine | Admitting: Emergency Medicine

## 2016-11-29 ENCOUNTER — Encounter (HOSPITAL_COMMUNITY): Payer: Self-pay | Admitting: *Deleted

## 2016-11-29 DIAGNOSIS — R112 Nausea with vomiting, unspecified: Secondary | ICD-10-CM | POA: Insufficient documentation

## 2016-11-29 DIAGNOSIS — Z5321 Procedure and treatment not carried out due to patient leaving prior to being seen by health care provider: Secondary | ICD-10-CM | POA: Insufficient documentation

## 2016-11-29 LAB — CBC
HCT: 50.4 % (ref 39.0–52.0)
HEMOGLOBIN: 17.3 g/dL — AB (ref 13.0–17.0)
MCH: 33.3 pg (ref 26.0–34.0)
MCHC: 34.3 g/dL (ref 30.0–36.0)
MCV: 96.9 fL (ref 78.0–100.0)
PLATELETS: 264 10*3/uL (ref 150–400)
RBC: 5.2 MIL/uL (ref 4.22–5.81)
RDW: 13 % (ref 11.5–15.5)
WBC: 6.3 10*3/uL (ref 4.0–10.5)

## 2016-11-29 LAB — COMPREHENSIVE METABOLIC PANEL
ALT: 8 U/L — AB (ref 17–63)
ANION GAP: 8 (ref 5–15)
AST: 22 U/L (ref 15–41)
Albumin: 4.3 g/dL (ref 3.5–5.0)
Alkaline Phosphatase: 77 U/L (ref 38–126)
BUN: <5 mg/dL — ABNORMAL LOW (ref 6–20)
CHLORIDE: 104 mmol/L (ref 101–111)
CO2: 28 mmol/L (ref 22–32)
CREATININE: 1.2 mg/dL (ref 0.61–1.24)
Calcium: 9.5 mg/dL (ref 8.9–10.3)
Glucose, Bld: 97 mg/dL (ref 65–99)
Potassium: 4.4 mmol/L (ref 3.5–5.1)
SODIUM: 140 mmol/L (ref 135–145)
Total Bilirubin: 0.8 mg/dL (ref 0.3–1.2)
Total Protein: 7.8 g/dL (ref 6.5–8.1)

## 2016-11-29 LAB — LIPASE, BLOOD: Lipase: 20 U/L (ref 11–51)

## 2016-11-29 NOTE — ED Triage Notes (Signed)
The pt is c/o nausea and vomiting for 2 days he does not have p;ain now.  He missed work today

## 2016-11-29 NOTE — ED Notes (Signed)
Pt stated he is only here for a doctors note due to a stomach virus.  Pt. States that he cannot wait any longer and will come back later.   Pt LWBS

## 2016-12-18 ENCOUNTER — Ambulatory Visit: Payer: Self-pay

## 2017-04-16 ENCOUNTER — Other Ambulatory Visit: Payer: Self-pay

## 2017-04-16 ENCOUNTER — Ambulatory Visit: Payer: Self-pay

## 2017-04-16 DIAGNOSIS — B20 Human immunodeficiency virus [HIV] disease: Secondary | ICD-10-CM

## 2017-04-16 MED ORDER — BICTEGRAVIR-EMTRICITAB-TENOFOV 50-200-25 MG PO TABS: 1.0000 | ORAL_TABLET | Freq: Every day | ORAL | 3 refills | Status: DC

## 2017-04-16 NOTE — Addendum Note (Signed)
Addended by: Yisroel Mullendore D on: 04/16/2017 09:59 AM   Modules accepted: Orders  

## 2017-04-16 NOTE — Addendum Note (Signed)
Addended by: Dare Sanger D on: 04/16/2017 09:59 AM   Modules accepted: Orders  

## 2017-04-16 NOTE — Addendum Note (Signed)
Addended by: Mariea ClontsGREEN, Anayiah Howden D on: 04/16/2017 09:59 AM   Modules accepted: Orders

## 2017-04-16 NOTE — Addendum Note (Signed)
Addended by: Mariea ClontsGREEN, Chrissi Crow D on: 04/16/2017 10:00 AM   Modules accepted: Orders

## 2017-04-17 ENCOUNTER — Encounter: Payer: Self-pay | Admitting: Infectious Disease

## 2017-04-17 LAB — COMPLETE METABOLIC PANEL WITH GFR
AG Ratio: 1.4 (calc) (ref 1.0–2.5)
ALBUMIN MSPROF: 4.2 g/dL (ref 3.6–5.1)
ALT: 4 U/L — ABNORMAL LOW (ref 9–46)
AST: 14 U/L (ref 10–40)
Alkaline phosphatase (APISO): 79 U/L (ref 40–115)
BUN: 9 mg/dL (ref 7–25)
CO2: 29 mmol/L (ref 20–32)
CREATININE: 1.23 mg/dL (ref 0.60–1.35)
Calcium: 9.4 mg/dL (ref 8.6–10.3)
Chloride: 102 mmol/L (ref 98–110)
GFR, Est African American: 83 mL/min/{1.73_m2} (ref >=60)
GFR, Est Non African American: 72 mL/min/{1.73_m2} (ref >=60)
GLUCOSE: 109 mg/dL — AB (ref 65–99)
Globulin: 2.9 g/dL (calc) (ref 1.9–3.7)
Potassium: 4.5 mmol/L (ref 3.5–5.3)
Sodium: 139 mmol/L (ref 135–146)
TOTAL PROTEIN: 7.1 g/dL (ref 6.1–8.1)
Total Bilirubin: 0.7 mg/dL (ref 0.2–1.2)

## 2017-04-17 LAB — CBC WITH DIFFERENTIAL/PLATELET
BASOS PCT: 0.3 %
Basophils Absolute: 27 cells/uL (ref 0–200)
Eosinophils Absolute: 0 cells/uL — ABNORMAL LOW (ref 15–500)
Eosinophils Relative: 0 %
HEMATOCRIT: 45 % (ref 38.5–50.0)
HEMOGLOBIN: 16.1 g/dL (ref 13.2–17.1)
LYMPHS ABS: 1583 {cells}/uL (ref 850–3900)
MCH: 32.3 pg (ref 27.0–33.0)
MCHC: 35.8 g/dL (ref 32.0–36.0)
MCV: 90.2 fL (ref 80.0–100.0)
MPV: 10.1 fL (ref 7.5–12.5)
Monocytes Relative: 5.9 %
Neutro Abs: 6952 cells/uL (ref 1500–7800)
Neutrophils Relative %: 76.4 %
Platelets: 282 10*3/uL (ref 140–400)
RBC: 4.99 10*6/uL (ref 4.20–5.80)
RDW: 11.9 % (ref 11.0–15.0)
Total Lymphocyte: 17.4 %
WBC: 9.1 10*3/uL (ref 3.8–10.8)
WBCMIX: 537 {cells}/uL (ref 200–950)

## 2017-04-17 LAB — T-HELPER CELL (CD4) - (RCID CLINIC ONLY)
CD4 T CELL ABS: 320 /uL — AB (ref 400–2700)
CD4 T CELL HELPER: 18 % — AB (ref 33–55)

## 2017-04-17 LAB — URINE CYTOLOGY ANCILLARY ONLY
Chlamydia: NEGATIVE
Neisseria Gonorrhea: NEGATIVE

## 2017-04-17 LAB — RPR: RPR Ser Ql: NONREACTIVE

## 2017-04-26 LAB — HIV RNA, RTPCR W/R GT (RTI, PI,INT)
HIV 1 RNA Quant: <20 {copies}/mL
HIV-1 RNA Quant, Log: <1.3 {Log_copies}/mL

## 2017-05-16 ENCOUNTER — Ambulatory Visit: Payer: Self-pay | Admitting: Infectious Disease

## 2017-06-21 ENCOUNTER — Other Ambulatory Visit: Payer: Self-pay | Admitting: Infectious Disease

## 2017-06-21 DIAGNOSIS — B20 Human immunodeficiency virus [HIV] disease: Secondary | ICD-10-CM

## 2018-11-02 ENCOUNTER — Other Ambulatory Visit: Payer: Self-pay

## 2018-11-02 ENCOUNTER — Emergency Department (HOSPITAL_COMMUNITY)
Admission: EM | Admit: 2018-11-02 | Discharge: 2018-11-02 | Disposition: A | Discharge status: Home / Self Care | Payer: Self-pay | Attending: Emergency Medicine | Admitting: Emergency Medicine

## 2018-11-02 ENCOUNTER — Emergency Department (HOSPITAL_COMMUNITY): Payer: Self-pay

## 2018-11-02 ENCOUNTER — Encounter (HOSPITAL_COMMUNITY): Payer: Self-pay | Admitting: Emergency Medicine

## 2018-11-02 DIAGNOSIS — Z21 Asymptomatic human immunodeficiency virus [HIV] infection status: Secondary | ICD-10-CM | POA: Insufficient documentation

## 2018-11-02 DIAGNOSIS — F17210 Nicotine dependence, cigarettes, uncomplicated: Secondary | ICD-10-CM | POA: Insufficient documentation

## 2018-11-02 DIAGNOSIS — Z79899 Other long term (current) drug therapy: Secondary | ICD-10-CM | POA: Insufficient documentation

## 2018-11-02 DIAGNOSIS — N451 Epididymitis: Secondary | ICD-10-CM | POA: Insufficient documentation

## 2018-11-02 DIAGNOSIS — I10 Essential (primary) hypertension: Secondary | ICD-10-CM | POA: Insufficient documentation

## 2018-11-02 LAB — URINALYSIS, ROUTINE W REFLEX MICROSCOPIC
Bacteria, UA: NONE SEEN
Bilirubin Urine: NEGATIVE
Glucose, UA: NEGATIVE mg/dL
Ketones, ur: 20 mg/dL — AB
Nitrite: NEGATIVE
Protein, ur: 30 mg/dL — AB
Specific Gravity, Urine: 1.02 (ref 1.005–1.030)
pH: 5 (ref 5.0–8.0)

## 2018-11-02 LAB — CBC WITH DIFFERENTIAL/PLATELET
Abs Immature Granulocytes: 0.08 10*3/uL — ABNORMAL HIGH (ref 0.00–0.07)
Basophils Absolute: 0 10*3/uL (ref 0.0–0.1)
Basophils Relative: 0 %
Eosinophils Absolute: 0 10*3/uL (ref 0.0–0.5)
Eosinophils Relative: 0 %
HCT: 50.3 % (ref 39.0–52.0)
Hemoglobin: 17.5 g/dL — ABNORMAL HIGH (ref 13.0–17.0)
Immature Granulocytes: 1 %
Lymphocytes Relative: 10 %
Lymphs Abs: 1.4 10*3/uL (ref 0.7–4.0)
MCH: 33.4 pg (ref 26.0–34.0)
MCHC: 34.8 g/dL (ref 30.0–36.0)
MCV: 96 fL (ref 80.0–100.0)
Monocytes Absolute: 1 10*3/uL (ref 0.1–1.0)
Monocytes Relative: 7 %
Neutro Abs: 11.7 10*3/uL — ABNORMAL HIGH (ref 1.7–7.7)
Neutrophils Relative %: 82 %
Platelets: 225 10*3/uL (ref 150–400)
RBC: 5.24 MIL/uL (ref 4.22–5.81)
RDW: 12.4 % (ref 11.5–15.5)
WBC: 14.3 10*3/uL — ABNORMAL HIGH (ref 4.0–10.5)
nRBC: 0 % (ref 0.0–0.2)

## 2018-11-02 LAB — BASIC METABOLIC PANEL
Anion gap: 14 (ref 5–15)
BUN: 7 mg/dL (ref 6–20)
CO2: 24 mmol/L (ref 22–32)
Calcium: 9.6 mg/dL (ref 8.9–10.3)
Chloride: 95 mmol/L — ABNORMAL LOW (ref 98–111)
Creatinine, Ser: 1.17 mg/dL (ref 0.61–1.24)
GFR calc Af Amer: >60 mL/min (ref >60)
GFR calc non Af Amer: >60 mL/min (ref >60)
Glucose, Bld: 106 mg/dL — ABNORMAL HIGH (ref 70–99)
Potassium: 4.4 mmol/L (ref 3.5–5.1)
Sodium: 133 mmol/L — ABNORMAL LOW (ref 135–145)

## 2018-11-02 MED ORDER — DOXYCYCLINE HYCLATE 100 MG PO TABS
100.0000 mg | ORAL_TABLET | Freq: Once | ORAL | Status: AC
  Administered 2018-11-02: 100 mg via ORAL
  Filled 2018-11-02: qty 1

## 2018-11-02 MED ORDER — CEFTRIAXONE SODIUM 250 MG IJ SOLR
250.0000 mg | Freq: Once | INTRAMUSCULAR | Status: AC
  Administered 2018-11-02: 14:00:00 250 mg via INTRAMUSCULAR
  Filled 2018-11-02: qty 250

## 2018-11-02 MED ORDER — DOXYCYCLINE HYCLATE 100 MG PO CAPS: 100.0000 mg | ORAL_CAPSULE | Freq: Two times a day (BID) | ORAL | 0 refills | Status: DC

## 2018-11-02 NOTE — ED Notes (Signed)
Patient transported to Ultrasound 

## 2018-11-02 NOTE — ED Triage Notes (Signed)
Pt reports right testicle pain that's constant for 3 days, denies injury. States it is swollen, denies urinary symptoms. Reports it feels better when he stands up "hunched over". Reports he is out of his bp medication x 2 months.

## 2018-11-02 NOTE — Discharge Instructions (Addendum)
It is important that you take your antibiotics as prescribed until completely gone.  You have been given your first dose today of the antibiotic, you will be due for 1 more dose before bedtime and then continue taking every 12 hours. You can apply ice to your testicles for 15 to 20 minutes at a time to help with pain and swelling. Elevation of your testicles will also provide some improvement, a tight boxer brief can help with this. Take ibuprofen every 6 hours for pain and swelling. It is very important that you follow up closely with your infectious disease doctor for regular monitoring and for your medication management. Follow-up with the urologist to ensure resolution of your infection. If you develop high fevers, pain with bowel movements, or your symptoms do not improve, return to the emergency department for further evaluation. You have been tested today for sexually transmitted diseases.  Avoid any intercourse until you know all of your results.  The hospital will contact you with any positive results.  If you have any positive results, inform all of your sexual partners so they can receive treatment as well.

## 2018-11-02 NOTE — ED Provider Notes (Signed)
[facility] EMERGENCY [facility]PARTMENT Provider Note   CSN: [id number] Arrival date & time: 11/02/18  1122     History   Chief Complaint Chief Complaint  Patient presents with  . Testicle Pain    HPI Gerald Guzman is a 43 y.o. male with past medical history of alcohol abuse, HIV, hypertension, presenting to the emergency department with 3 days of right testicular pain and swelling.  He states his pain is worse when standing or with movement.  He reports some discomfort radiating into his abdomen.  Denies associated dysuria, penile discharge or pain, rectal pain with defecation, fevers.  He has been noncompliant for 1 month with his Biktarvy for HIV.  He has not been seen by infectious disease and some time, and last CD4 count was 320 with an undetectable viral load in March 2019.  He is sexually active with one male partner, intermittently uses protection.     The history is provided by the patient.    Past Medical History:  Diagnosis Date  . Alcoholism (HCC) 02/01/2016  . HIV infection (HCC)   . Hypertension     Patient Active Problem List   Diagnosis Date Noted  . Alcoholism (HCC) 02/01/2016  . HIV disease (HCC) 01/14/2014  . Smoker 01/14/2014  . Benign essential HTN 01/14/2014    Past Surgical History:  Procedure Laterality Date  . HAND SURGERY          Home Medications    Prior to Admission medications   Medication Sig Start Date End Date Taking? Authorizing Provider  bictegravir-emtricitabine-tenofovir AF (BIKTARVY) 50-200-25 MG TABS tablet Take 1 tablet by mouth daily. 04/16/17   Randall HissVan Dam, Cornelius N, MD  doxycycline (VIBRAMYCIN) 100 MG capsule Take 1 capsule (100 mg total) by mouth 2 (two) times daily. 11/02/18   Robinson, [place] N, PA-C  hydrochlorothiazide (HYDRODIURIL) 25 MG tablet TAKE 1 TABLET(25 MG) BY MOUTH DAILY 08/14/16   Daiva EvesVan Dam, Lisette Grinderornelius N, MD    Family History Family History  Problem Relation Age of Onset  . Other Mother    murdered with gunshot  . COPD Maternal Grandmother   . Heart disease Maternal Grandmother   . Other Maternal Grandfather     Social History Social History   Tobacco Use  . Smoking status: Current Every Day Smoker    Packs/day: 1.00    Types: Cigarettes  . Smokeless tobacco: Never Used  . Tobacco comment: "I'll just stop them when I'm ready, but not yet"  Substance Use Topics  . Alcohol use: Yes    Alcohol/week: 0.0 standard drinks    Comment: 2-3 40 oz beers a day  . Drug use: Yes    Types: Marijuana     Allergies   Patient has no known allergies.   Review of Systems Review of Systems  Constitutional: Negative for fever.  Gastrointestinal: Positive for abdominal pain. Negative for nausea, rectal pain and vomiting.  Genitourinary: Positive for scrotal swelling and testicular pain. Negative for discharge, dysuria, penile pain and penile swelling.  Allergic/Immunologic: Positive for immunocompromised state.  All other systems reviewed and are negative.    Physical Exam Updated Vital Signs BP (!) 156/117 (BP Location: Right Arm)   Pulse 100   Temp 98.6 F (37 C) (Oral)   Resp 16   SpO2 99%   Physical Exam Vitals signs and nursing note reviewed.  Constitutional:      General: He is not in acute distress.    Appearance: He is well-developed.  He is not ill-appearing.  HENT:     Head: Normocephalic and atraumatic.  Eyes:     Conjunctiva/sclera: Conjunctivae normal.  Cardiovascular:     Rate and Rhythm: Normal rate and regular rhythm.  Pulmonary:     Effort: Pulmonary effort is normal. No respiratory distress.     Breath sounds: Normal breath sounds.  Abdominal:     General: Bowel sounds are normal.     Palpations: Abdomen is soft.     Tenderness: There is no abdominal tenderness.  Genitourinary:    Comments: Exam performed with male NT chaperone present.  The right testicle appears swollen to about twice the size of the left.  The right testicle is firm to  the touch and tender, with the epididymis as well.  The scrotum itself does not appear erythematous or swollen. Positive prehn sign. The penis is circumcised without obvious discharge.  No tenderness.  There is some right-sided inguinal lymphadenopathy present. Skin:    General: Skin is warm.  Neurological:     Mental Status: He is alert.  Psychiatric:        Behavior: Behavior normal.      ED Treatments / Results  Labs (all labs ordered are listed, but only abnormal results are displayed) Labs Reviewed  URINALYSIS, ROUTINE W REFLEX MICROSCOPIC - Abnormal; Notable for the following components:      Result Value   Color, Urine AMBER (*)    Hgb urine dipstick MODERATE (*)    Ketones, ur 20 (*)    Protein, ur 30 (*)    Leukocytes,Ua TRACE (*)    All other components within normal limits  BASIC METABOLIC PANEL - Abnormal; Notable for the following components:   Sodium 133 (*)    Chloride 95 (*)    Glucose, Bld 106 (*)    All other components within normal limits  CBC WITH DIFFERENTIAL/PLATELET - Abnormal; Notable for the following components:   WBC 14.3 (*)    Hemoglobin 17.5 (*)    Neutro Abs 11.7 (*)    Abs Immature Granulocytes 0.08 (*)    All other components within normal limits  RPR  GC/CHLAMYDIA PROBE AMP ([place]) NOT AT [facility]    EKG None  Radiology US Scrotum W/doppler  Result Date: 11/02/2018 CLINICAL DATA:  Right scrotal pain for 3 days with swelling. HIV positive. EXAM: SCROTAL ULTRASOUND DOPPLER ULTRASOUND OF THE TESTICLES TECHNIQUE: Complete ultrasound examination of the testicles, epididymis, and other scrotal structures was performed. Color and spectral Doppler ultrasound were also utilized to evaluate blood flow to the testicles. COMPARISON:  None. FINDINGS: Right testicle Measurements: 5.2 x 3.2 x 4.1 cm. No mass or microlithiasis visualized. Left testicle Measurements: 4.5 x 2.8 x 3.3 cm. No mass or microlithiasis visualized. Right epididymis: Asymmetric  thickening and heterogeneity in right epididymal body and tail with minimal hyperemia demonstrated on color Doppler. Left epididymis:  Normal in size and appearance. Hydrocele:  Small right hydrocele.  No left hydrocele. Varicocele:  None visualized. Pulsed Doppler interrogation of both testes demonstrates normal low resistance arterial and venous waveforms bilaterally. IMPRESSION: 1. Findings suggest mild acute right epididymitis. 2. Small right hydrocele. 3. Normal testes with no evidence of testicular torsion. Electronically Signed   By: Delbert Phenix M.D.   On: 11/02/2018 13:29    Procedures Procedures (including critical care time)  Medications Ordered in ED Medications  cefTRIAXone (ROCEPHIN) injection 250 mg (has no administration in time range)  doxycycline (VIBRA-TABS) tablet 100 mg (has no administration  in time range)     Initial Impression / Assessment and Plan / ED Course  I have reviewed the triage vital signs and the nursing notes.  Pertinent labs & imaging results that were available during my care of the patient were reviewed by me and considered in my medical decision making (see chart for details).        Sexually active male, HIV positive, presenting with right testicular pain and swelling x3 days. Pt is sexually active with male partners without protection.  He is afebrile and not ill-appearing.  He is noncompliant with his antiretrovirals x1 month though he is followed by infectious disease.  He needs to schedule a follow-up appointment soon for labs.  Given his noncompliance, basic labs were obtained here which showed a leukocytosis of 14.3, otherwise lab work is unremarkable.  UA with some hemoglobin, trace leuks and white cells though no bacteria.  GC/chlamydia culture sent as well as RPR.  Ultrasound is consistent with right epididymitis.  Will treat as such with a dose of IM Rocephin here in the ED and 10-day course of p.o. doxycycline.  He is given urology referral  for follow-up as needed and instructed importance of close follow-up with his infectious disease provider.  He directed to avoid any intercourse until he knows all of his STD results.  Patient has been advised to rest, ice and scrotal support to help he is pain.  NSAIDs for pain.  Patient is agreeable to plan and safe for discharge.  Patient discussed with Dr. Vanita Panda.  Discussed results, findings, treatment and follow up. Patient advised of return precautions. Patient verbalized understanding and agreed with plan.  Final Clinical Impressions(s) / ED Diagnoses   Final diagnoses:  Epididymitis    ED Discharge Orders         Ordered    doxycycline (VIBRAMYCIN) 100 MG capsule  2 times daily     11/02/18 1357           Robinson, [place] N, [place] 11/02/18 1415    Carmin Muskrat, MD 11/03/18 [phone]

## 2018-11-02 NOTE — ED Notes (Addendum)
Patient transported to Ultrasound 

## 2018-11-03 LAB — RPR
RPR Ser Ql: REACTIVE — AB
RPR Titer: 1:16 {titer}

## 2018-11-05 LAB — GC/CHLAMYDIA PROBE AMP (~~LOC~~) NOT AT ARMC
Chlamydia: NEGATIVE
Neisseria Gonorrhea: POSITIVE — AB

## 2018-11-11 LAB — T.PALLIDUM AB, TOTAL: T Pallidum Abs: REACTIVE — AB

## 2018-11-20 ENCOUNTER — Telehealth: Payer: Self-pay

## 2018-11-20 NOTE — Telephone Encounter (Signed)
Received call from Carlton Adam with [facility] calling to make appointment for patient. Patient has not been seen since 04/2017 and also out of medication. Santiago Glad will fax notes from today and HD will treat patient with x3 Bicillin injections. Santiago Glad notified patient of appointment during the call. Gerald Guzman

## 2018-11-20 NOTE — Telephone Encounter (Signed)
Thank you, Cathie Beams

## 2018-11-22 ENCOUNTER — Other Ambulatory Visit: Payer: Self-pay

## 2018-11-22 ENCOUNTER — Ambulatory Visit: Payer: Self-pay

## 2018-12-06 NOTE — Progress Notes (Deleted)
Name: Gerald Guzman  DOB: 1975/10/31 MRN: [id number] PCP: Patient, No Pcp Per    Patient Active Problem List   Diagnosis Date Noted  . Alcoholism ([place]) 02/01/2016  . HIV disease ([place]) 01/14/2014  . Smoker 01/14/2014  . Benign essential HTN 01/14/2014     Brief Narrative:  Gerald Guzman is a 43 y.o. male with HIV disease, Dx ***. Marland Kitchen CD4 nadir *** VL *** HIV Risk: *** History of OIs: *** Intake Labs ***: Hep B sAg (***), sAb (***), cAb (***); Hep A (***), Hep C (***) Quantiferon (***) HLA B*5701 (***) G6PD: (***)   Previous Regimens: Jorje Guild 2018 >> poor adherence  . Biktarvy   Genotypes: . ***  Subjective:  No chief complaint on file.    HPI:   Was last seen in 2018 by Dr. Tommy Medal and switched from [place] to [place] in the setting of poor adherence to [facility]. Last med fill was 03/2017 x 4 months. He was seen in the ER a month ago with testicular pain and swelling in the absence of other GU symptoms. He was given 10d doxy + ceftriaxone injection. Also found to have + RPR with titer 1:16. He started treatment with HD weekly injections 10/21 and completed all injections.   Depression screen PHQ 2/9 08/12/2014  Decreased Interest 0  Down, Depressed, Hopeless 0  PHQ - 2 Score 0    Health Maintenance = Receives annual preventative care through {his/her} PCP and is up to date on all recommended screenings and vaccinations. {He/She} denies cigarette use, excessive alcohol use and other substance abuse. {He/She} is seeing a dentist regularly and no dental complaints today.   ROS  Past Medical History:  Diagnosis Date  . Alcoholism ([place]) 02/01/2016  . HIV infection ([place])   . Hypertension     Outpatient Medications Prior to Visit  Medication Sig Dispense Refill  . bictegravir-emtricitabine-tenofovir AF (BIKTARVY) 50-200-25 MG TABS tablet Take 1 tablet by mouth daily. 30 tablet 3  . doxycycline (VIBRAMYCIN) 100 MG capsule Take 1 capsule (100 mg total) by mouth  2 (two) times daily. 20 capsule 0  . hydrochlorothiazide (HYDRODIURIL) 25 MG tablet TAKE 1 TABLET(25 MG) BY MOUTH DAILY 30 tablet 5   No facility-administered medications prior to visit.      No Known Allergies  Social History   Tobacco Use  . Smoking status: Current Every Day Smoker    Packs/day: 1.00    Types: Cigarettes  . Smokeless tobacco: Never Used  . Tobacco comment: "I'll just stop them when I'm ready, but not yet"  Substance Use Topics  . Alcohol use: Yes    Alcohol/week: 0.0 standard drinks    Comment: 2-3 40 oz beers a day  . Drug use: Yes    Types: Marijuana    Family History  Problem Relation Age of Onset  . Other Mother        murdered with gunshot  . COPD Maternal Grandmother   . Heart disease Maternal Grandmother   . Other Maternal Grandfather     Social History   Substance and Sexual Activity  Sexual Activity Not Currently  . Partners: Female  . Birth control/protection: Condom   Comment: declined condoms     Objective:  There were no vitals filed for this visit. There is no height or weight on file to calculate BMI.  Physical Exam  Lab Results Lab Results  Component Value Date   WBC 14.3 (H) 11/02/2018   HGB 17.5 (  H) 11/02/2018   HCT 50.3 11/02/2018   MCV 96.0 11/02/2018   PLT 225 11/02/2018    Lab Results  Component Value Date   CREATININE 1.17 11/02/2018   BUN 7 11/02/2018   NA 133 (L) 11/02/2018   K 4.4 11/02/2018   CL 95 (L) 11/02/2018   CO2 24 11/02/2018    Lab Results  Component Value Date   ALT 4 (L) 04/16/2017   AST 14 04/16/2017   ALKPHOS 77 11/29/2016   BILITOT 0.7 04/16/2017    Lab Results  Component Value Date   CHOL 201 (H) 10/10/2016   HDL 61 10/10/2016   LDLCALC 111 (H) 10/10/2016   TRIG 171 (H) 10/10/2016   CHOLHDL 3.3 10/10/2016   HIV 1 RNA Quant (copies/mL)  Date Value  04/16/2017 <20 NOT DETECTED  10/10/2016 <20 NOT DETECTED  08/16/2015 <20   CD4 T Cell Abs (/uL)  Date Value  04/16/2017 320  (L)  10/10/2016 820  05/30/2016 540     Assessment & Plan:   Problem List Items Addressed This Visit    None      Janene Madeira, MSN, NP-C [facility] [place] Pager: [phone] Office: [phone]  12/06/18  2:43 PM

## 2018-12-09 ENCOUNTER — Ambulatory Visit: Payer: Self-pay | Admitting: Infectious Diseases

## 2023-04-08 ENCOUNTER — Encounter (HOSPITAL_COMMUNITY): Payer: Self-pay

## 2023-04-08 ENCOUNTER — Emergency Department (HOSPITAL_COMMUNITY)
Admission: EM | Admit: 2023-04-08 | Discharge: 2023-04-08 | Discharge status: Against Medical Advice | Payer: Self-pay | Attending: Emergency Medicine | Admitting: Emergency Medicine

## 2023-04-08 DIAGNOSIS — R2243 Localized swelling, mass and lump, lower limb, bilateral: Secondary | ICD-10-CM | POA: Insufficient documentation

## 2023-04-08 DIAGNOSIS — Z5321 Procedure and treatment not carried out due to patient leaving prior to being seen by health care provider: Secondary | ICD-10-CM | POA: Insufficient documentation

## 2023-04-08 DIAGNOSIS — R070 Pain in throat: Secondary | ICD-10-CM | POA: Insufficient documentation

## 2023-04-08 LAB — COMPREHENSIVE METABOLIC PANEL
ALT: 7 U/L (ref 0–44)
AST: 24 U/L (ref 15–41)
Albumin: 4 g/dL (ref 3.5–5.0)
Alkaline Phosphatase: 70 U/L (ref 38–126)
Anion gap: 15 (ref 5–15)
BUN: 6 mg/dL (ref 6–20)
CO2: 24 mmol/L (ref 22–32)
Calcium: 9.5 mg/dL (ref 8.9–10.3)
Chloride: 101 mmol/L (ref 98–111)
Creatinine, Ser: 0.83 mg/dL (ref 0.61–1.24)
GFR, Estimated: >60 mL/min (ref >60)
Glucose, Bld: 110 mg/dL — ABNORMAL HIGH (ref 70–99)
Potassium: 3.9 mmol/L (ref 3.5–5.1)
Sodium: 140 mmol/L (ref 135–145)
Total Bilirubin: 0.9 mg/dL (ref 0.0–1.2)
Total Protein: 7.9 g/dL (ref 6.5–8.1)

## 2023-04-08 LAB — CBC WITH DIFFERENTIAL/PLATELET
Abs Immature Granulocytes: 0.03 10*3/uL (ref 0.00–0.07)
Basophils Absolute: 0 10*3/uL (ref 0.0–0.1)
Basophils Relative: 0 %
Eosinophils Absolute: 0 10*3/uL (ref 0.0–0.5)
Eosinophils Relative: 1 %
HCT: 45.6 % (ref 39.0–52.0)
Hemoglobin: 15.2 g/dL (ref 13.0–17.0)
Immature Granulocytes: 0 %
Lymphocytes Relative: 12 %
Lymphs Abs: 0.8 10*3/uL (ref 0.7–4.0)
MCH: 30.6 pg (ref 26.0–34.0)
MCHC: 33.3 g/dL (ref 30.0–36.0)
MCV: 91.8 fL (ref 80.0–100.0)
Monocytes Absolute: 0.5 10*3/uL (ref 0.1–1.0)
Monocytes Relative: 8 %
Neutro Abs: 5.4 10*3/uL (ref 1.7–7.7)
Neutrophils Relative %: 79 %
Platelets: 236 10*3/uL (ref 150–400)
RBC: 4.97 MIL/uL (ref 4.22–5.81)
RDW: 12.2 % (ref 11.5–15.5)
WBC: 6.8 10*3/uL (ref 4.0–10.5)
nRBC: 0 % (ref 0.0–0.2)

## 2023-04-08 LAB — BRAIN NATRIURETIC PEPTIDE: B Natriuretic Peptide: 57.9 pg/mL (ref 0.0–100.0)

## 2023-04-08 LAB — TROPONIN I (HIGH SENSITIVITY)
Troponin I (High Sensitivity): 4 ng/L (ref <18)
Troponin I (High Sensitivity): 4 ng/L (ref <18)

## 2023-04-08 LAB — GROUP A STREP BY PCR: Group A Strep by PCR: NOT DETECTED

## 2023-04-08 NOTE — ED Triage Notes (Addendum)
 Pt c/o dysphagia x1 month and bilateral foot swelling x 2-3 weeks.  Pt reports he is homeless and unable to elevate legs.  Also, Pt has not taken any medications x2 years.  Hx of HIV and HTN.

## 2023-04-08 NOTE — ED Provider Triage Note (Signed)
 Emergency Medicine Provider Triage Evaluation Note  Gerald Guzman , a 48 y.o. male  was evaluated in triage.  Pt complains of  feet swelling and lump sensation in throat with burning. Denies feet pain. No N/V/D, fevers.   Review of Systems  Positive: Feet swelling, throat burning sensation Negative: N/V, abdominal pain  Physical Exam  BP (!) 165/121 (BP Location: Right Arm)   Pulse (!) 122   Temp 98.8 F (37.1 C)   Resp 16   Ht 6\' 3"  (1.905 m)   Wt 89.8 kg   SpO2 98%   BMI 24.75 kg/m  Gen:   Awake, no distress   Resp:  Normal effort  MSK:   Moves extremities without difficulty Other:  Bilateral feet swelling  Medical Decision Making  Medically screening exam initiated at 11:22 AM.  Appropriate orders placed.  Gerald Guzman was informed that the remainder of the evaluation will be completed by another provider, this initial triage assessment does not replace that evaluation, and the importance of remaining in the ED until their evaluation is complete.   Maxwell Marion, PA-C 04/08/23 1126

## 2023-06-30 ENCOUNTER — Emergency Department (HOSPITAL_COMMUNITY)

## 2023-06-30 ENCOUNTER — Encounter (HOSPITAL_COMMUNITY): Payer: Self-pay

## 2023-06-30 ENCOUNTER — Other Ambulatory Visit: Payer: Self-pay

## 2023-06-30 ENCOUNTER — Inpatient Hospital Stay (HOSPITAL_COMMUNITY)
Admission: EM | Admit: 2023-06-30 | Discharge: 2023-07-31 | DRG: 974 | Disposition: E | Attending: Internal Medicine | Admitting: Internal Medicine

## 2023-06-30 DIAGNOSIS — Z7189 Other specified counseling: Secondary | ICD-10-CM | POA: Diagnosis not present

## 2023-06-30 DIAGNOSIS — R64 Cachexia: Secondary | ICD-10-CM | POA: Diagnosis present

## 2023-06-30 DIAGNOSIS — J189 Pneumonia, unspecified organism: Secondary | ICD-10-CM

## 2023-06-30 DIAGNOSIS — R55 Syncope and collapse: Secondary | ICD-10-CM

## 2023-06-30 DIAGNOSIS — R918 Other nonspecific abnormal finding of lung field: Secondary | ICD-10-CM | POA: Diagnosis not present

## 2023-06-30 DIAGNOSIS — Z8249 Family history of ischemic heart disease and other diseases of the circulatory system: Secondary | ICD-10-CM

## 2023-06-30 DIAGNOSIS — F419 Anxiety disorder, unspecified: Secondary | ICD-10-CM | POA: Diagnosis present

## 2023-06-30 DIAGNOSIS — D751 Secondary polycythemia: Secondary | ICD-10-CM | POA: Diagnosis present

## 2023-06-30 DIAGNOSIS — F17210 Nicotine dependence, cigarettes, uncomplicated: Secondary | ICD-10-CM | POA: Diagnosis present

## 2023-06-30 DIAGNOSIS — W2209XA Striking against other stationary object, initial encounter: Secondary | ICD-10-CM | POA: Diagnosis present

## 2023-06-30 DIAGNOSIS — B457 Disseminated cryptococcosis: Secondary | ICD-10-CM | POA: Diagnosis present

## 2023-06-30 DIAGNOSIS — J690 Pneumonitis due to inhalation of food and vomit: Secondary | ICD-10-CM | POA: Diagnosis not present

## 2023-06-30 DIAGNOSIS — S90211A Contusion of right great toe with damage to nail, initial encounter: Secondary | ICD-10-CM

## 2023-06-30 DIAGNOSIS — Z515 Encounter for palliative care: Secondary | ICD-10-CM

## 2023-06-30 DIAGNOSIS — B370 Candidal stomatitis: Secondary | ICD-10-CM | POA: Diagnosis present

## 2023-06-30 DIAGNOSIS — F1020 Alcohol dependence, uncomplicated: Secondary | ICD-10-CM | POA: Diagnosis present

## 2023-06-30 DIAGNOSIS — Z21 Asymptomatic human immunodeficiency virus [HIV] infection status: Secondary | ICD-10-CM | POA: Diagnosis not present

## 2023-06-30 DIAGNOSIS — R531 Weakness: Secondary | ICD-10-CM | POA: Diagnosis not present

## 2023-06-30 DIAGNOSIS — E876 Hypokalemia: Secondary | ICD-10-CM

## 2023-06-30 DIAGNOSIS — R54 Age-related physical debility: Secondary | ICD-10-CM | POA: Diagnosis present

## 2023-06-30 DIAGNOSIS — R7881 Bacteremia: Secondary | ICD-10-CM | POA: Diagnosis not present

## 2023-06-30 DIAGNOSIS — D696 Thrombocytopenia, unspecified: Secondary | ICD-10-CM | POA: Diagnosis not present

## 2023-06-30 DIAGNOSIS — Z23 Encounter for immunization: Secondary | ICD-10-CM | POA: Diagnosis not present

## 2023-06-30 DIAGNOSIS — I160 Hypertensive urgency: Secondary | ICD-10-CM | POA: Diagnosis present

## 2023-06-30 DIAGNOSIS — G9341 Metabolic encephalopathy: Secondary | ICD-10-CM | POA: Diagnosis not present

## 2023-06-30 DIAGNOSIS — R590 Localized enlarged lymph nodes: Secondary | ICD-10-CM | POA: Diagnosis not present

## 2023-06-30 DIAGNOSIS — J9601 Acute respiratory failure with hypoxia: Secondary | ICD-10-CM | POA: Diagnosis not present

## 2023-06-30 DIAGNOSIS — E1165 Type 2 diabetes mellitus with hyperglycemia: Secondary | ICD-10-CM | POA: Diagnosis present

## 2023-06-30 DIAGNOSIS — S91201A Unspecified open wound of right great toe with damage to nail, initial encounter: Secondary | ICD-10-CM | POA: Diagnosis present

## 2023-06-30 DIAGNOSIS — A419 Sepsis, unspecified organism: Secondary | ICD-10-CM | POA: Diagnosis not present

## 2023-06-30 DIAGNOSIS — K229 Disease of esophagus, unspecified: Secondary | ICD-10-CM | POA: Diagnosis not present

## 2023-06-30 DIAGNOSIS — R52 Pain, unspecified: Secondary | ICD-10-CM

## 2023-06-30 DIAGNOSIS — J159 Unspecified bacterial pneumonia: Secondary | ICD-10-CM | POA: Diagnosis present

## 2023-06-30 DIAGNOSIS — B451 Cerebral cryptococcosis: Secondary | ICD-10-CM | POA: Diagnosis not present

## 2023-06-30 DIAGNOSIS — Z711 Person with feared health complaint in whom no diagnosis is made: Secondary | ICD-10-CM

## 2023-06-30 DIAGNOSIS — Y92149 Unspecified place in prison as the place of occurrence of the external cause: Secondary | ICD-10-CM | POA: Diagnosis not present

## 2023-06-30 DIAGNOSIS — E860 Dehydration: Secondary | ICD-10-CM | POA: Diagnosis present

## 2023-06-30 DIAGNOSIS — Z825 Family history of asthma and other chronic lower respiratory diseases: Secondary | ICD-10-CM

## 2023-06-30 DIAGNOSIS — R0602 Shortness of breath: Secondary | ICD-10-CM

## 2023-06-30 DIAGNOSIS — S91209A Unspecified open wound of unspecified toe(s) with damage to nail, initial encounter: Secondary | ICD-10-CM

## 2023-06-30 DIAGNOSIS — R112 Nausea with vomiting, unspecified: Secondary | ICD-10-CM | POA: Insufficient documentation

## 2023-06-30 DIAGNOSIS — B20 Human immunodeficiency virus [HIV] disease: Secondary | ICD-10-CM | POA: Diagnosis present

## 2023-06-30 DIAGNOSIS — Z91148 Patient's other noncompliance with medication regimen for other reason: Secondary | ICD-10-CM

## 2023-06-30 DIAGNOSIS — J188 Other pneumonia, unspecified organism: Secondary | ICD-10-CM | POA: Diagnosis not present

## 2023-06-30 DIAGNOSIS — R6520 Severe sepsis without septic shock: Secondary | ICD-10-CM | POA: Diagnosis present

## 2023-06-30 DIAGNOSIS — I10 Essential (primary) hypertension: Secondary | ICD-10-CM | POA: Diagnosis present

## 2023-06-30 DIAGNOSIS — R739 Hyperglycemia, unspecified: Secondary | ICD-10-CM | POA: Insufficient documentation

## 2023-06-30 DIAGNOSIS — Z681 Body mass index (BMI) 19 or less, adult: Secondary | ICD-10-CM

## 2023-06-30 DIAGNOSIS — E871 Hypo-osmolality and hyponatremia: Secondary | ICD-10-CM | POA: Diagnosis not present

## 2023-06-30 DIAGNOSIS — Z79899 Other long term (current) drug therapy: Secondary | ICD-10-CM

## 2023-06-30 DIAGNOSIS — J984 Other disorders of lung: Secondary | ICD-10-CM | POA: Diagnosis not present

## 2023-06-30 DIAGNOSIS — B957 Other staphylococcus as the cause of diseases classified elsewhere: Secondary | ICD-10-CM | POA: Diagnosis not present

## 2023-06-30 DIAGNOSIS — Z66 Do not resuscitate: Secondary | ICD-10-CM | POA: Diagnosis not present

## 2023-06-30 DIAGNOSIS — R627 Adult failure to thrive: Secondary | ICD-10-CM | POA: Diagnosis not present

## 2023-06-30 DIAGNOSIS — I38 Endocarditis, valve unspecified: Secondary | ICD-10-CM | POA: Diagnosis not present

## 2023-06-30 DIAGNOSIS — E43 Unspecified severe protein-calorie malnutrition: Secondary | ICD-10-CM | POA: Diagnosis present

## 2023-06-30 DIAGNOSIS — E222 Syndrome of inappropriate secretion of antidiuretic hormone: Secondary | ICD-10-CM | POA: Diagnosis not present

## 2023-06-30 LAB — URINALYSIS, ROUTINE W REFLEX MICROSCOPIC
Bacteria, UA: NONE SEEN
Bilirubin Urine: NEGATIVE
Glucose, UA: 50 mg/dL — AB
Hgb urine dipstick: NEGATIVE
Ketones, ur: NEGATIVE mg/dL
Leukocytes,Ua: NEGATIVE
Nitrite: NEGATIVE
Protein, ur: 30 mg/dL — AB
Specific Gravity, Urine: 1.026 (ref 1.005–1.030)
pH: 6 (ref 5.0–8.0)

## 2023-06-30 LAB — CBC
HCT: 50.8 % (ref 39.0–52.0)
Hemoglobin: 17.7 g/dL — ABNORMAL HIGH (ref 13.0–17.0)
MCH: 29.8 pg (ref 26.0–34.0)
MCHC: 34.8 g/dL (ref 30.0–36.0)
MCV: 85.7 fL (ref 80.0–100.0)
Platelets: 240 10*3/uL (ref 150–400)
RBC: 5.93 MIL/uL — ABNORMAL HIGH (ref 4.22–5.81)
RDW: 10.9 % — ABNORMAL LOW (ref 11.5–15.5)
WBC: 12 10*3/uL — ABNORMAL HIGH (ref 4.0–10.5)
nRBC: 0 % (ref 0.0–0.2)

## 2023-06-30 LAB — COMPREHENSIVE METABOLIC PANEL WITH GFR
ALT: 24 U/L (ref 0–44)
AST: 35 U/L (ref 15–41)
Albumin: 4.1 g/dL (ref 3.5–5.0)
Alkaline Phosphatase: 92 U/L (ref 38–126)
Anion gap: 15 (ref 5–15)
BUN: 27 mg/dL — ABNORMAL HIGH (ref 6–20)
CO2: 34 mmol/L — ABNORMAL HIGH (ref 22–32)
Calcium: 9.4 mg/dL (ref 8.9–10.3)
Chloride: 75 mmol/L — ABNORMAL LOW (ref 98–111)
Creatinine, Ser: 0.91 mg/dL (ref 0.61–1.24)
GFR, Estimated: >60 mL/min (ref >60)
Glucose, Bld: 193 mg/dL — ABNORMAL HIGH (ref 70–99)
Potassium: 2.3 mmol/L — CL (ref 3.5–5.1)
Sodium: 124 mmol/L — ABNORMAL LOW (ref 135–145)
Total Bilirubin: 1.3 mg/dL — ABNORMAL HIGH (ref 0.0–1.2)
Total Protein: 8.3 g/dL — ABNORMAL HIGH (ref 6.5–8.1)

## 2023-06-30 LAB — MAGNESIUM: Magnesium: 2.4 mg/dL (ref 1.7–2.4)

## 2023-06-30 MED ORDER — SODIUM CHLORIDE 0.9 % IV BOLUS
1000.0000 mL | Freq: Once | INTRAVENOUS | Status: AC
  Administered 2023-06-30: 1000 mL via INTRAVENOUS

## 2023-06-30 MED ORDER — POTASSIUM CHLORIDE 10 MEQ/100ML IV SOLN
10.0000 meq | INTRAVENOUS | Status: AC
  Administered 2023-06-30 (×3): 10 meq via INTRAVENOUS
  Filled 2023-06-30 (×3): qty 100

## 2023-06-30 MED ORDER — IOHEXOL 300 MG/ML  SOLN
100.0000 mL | Freq: Once | INTRAMUSCULAR | Status: AC | PRN
  Administered 2023-06-30: 100 mL via INTRAVENOUS

## 2023-06-30 MED ORDER — POTASSIUM CHLORIDE 20 MEQ PO PACK
40.0000 meq | PACK | Freq: Once | ORAL | Status: AC
  Administered 2023-06-30: 40 meq via ORAL
  Filled 2023-06-30: qty 2

## 2023-06-30 MED ORDER — POTASSIUM CHLORIDE CRYS ER 20 MEQ PO TBCR
40.0000 meq | EXTENDED_RELEASE_TABLET | Freq: Once | ORAL | Status: AC
  Administered 2023-06-30: 40 meq via ORAL
  Filled 2023-06-30: qty 2

## 2023-06-30 MED ORDER — TETANUS-DIPHTH-ACELL PERTUSSIS 5-2.5-18.5 LF-MCG/0.5 IM SUSY
0.5000 mL | PREFILLED_SYRINGE | Freq: Once | INTRAMUSCULAR | Status: AC
  Administered 2023-06-30: 0.5 mL via INTRAMUSCULAR
  Filled 2023-06-30: qty 0.5

## 2023-06-30 MED ORDER — LACTATED RINGERS IV SOLN: INTRAVENOUS | Status: AC

## 2023-06-30 MED ORDER — LACTATED RINGERS IV BOLUS
1000.0000 mL | Freq: Once | INTRAVENOUS | Status: AC
  Administered 2023-06-30: 1000 mL via INTRAVENOUS

## 2023-06-30 MED ORDER — ENOXAPARIN SODIUM 40 MG/0.4ML IJ SOSY
40.0000 mg | PREFILLED_SYRINGE | INTRAMUSCULAR | Status: DC
Start: 2023-06-30 — End: 2023-07-01
  Administered 2023-06-30: 40 mg via SUBCUTANEOUS
  Filled 2023-06-30: qty 0.4

## 2023-06-30 MED ORDER — AMLODIPINE BESYLATE 10 MG PO TABS
10.0000 mg | ORAL_TABLET | Freq: Every day | ORAL | Status: DC
  Administered 2023-06-30 – 2023-07-01 (×2): 10 mg via ORAL
  Filled 2023-06-30: qty 1
  Filled 2023-06-30: qty 2

## 2023-06-30 NOTE — ED Provider Notes (Signed)
 [place] EMERGENCY DEPARTMENT AT [facility] Provider Note   CSN: [id number] Arrival date & time: 06/30/23  1741     History  Chief Complaint  Patient presents with  . Weakness  . Near Syncope    Gerald Guzman is a 48 y.o. male.  Pt hx hiv, sent to ED via EMS from jail where patient has been residing for past couple months. Pt notes general weakness, feels faint when stands. Symptoms present in past 1-2 weeks. Today did stub right toe, poor nail care/hygiene at baseline, and right great toe nail almost entirely off, bleeding under nail. Denies fever/chills. No syncope. No chest pain or discomfort. No sob. No new or worsening cough. No abd pain or nvd. No dysuria or gu c/o. No other focal extremity pain or swelling. Tetanus not known.  The history is provided by the patient, medical records, the EMS personnel and the police. The history is limited by the condition of the patient.  Toe Pain Pertinent negatives include no chest pain, no abdominal pain, no headaches and no shortness of breath.       Home Medications Prior to Admission medications   Medication Sig Start Date End Date Taking? Authorizing Provider  bictegravir-emtricitabine-tenofovir  AF (BIKTARVY) 50-200-25 MG TABS tablet Take 1 tablet by mouth daily. 04/16/17   Charolette Copier, MD  doxycycline  (VIBRAMYCIN ) 100 MG capsule Take 1 capsule (100 mg total) by mouth 2 (two) times daily. 11/02/18   Robinson, Jordan N, PA-C  hydrochlorothiazide  (HYDRODIURIL ) 25 MG tablet TAKE 1 TABLET(25 MG) BY MOUTH DAILY 08/14/16   Ernie Heal, Jerelyn Money, MD      Allergies    Patient has no known allergies.    Review of Systems   Review of Systems  Constitutional:  Negative for chills and fever.  HENT:  Negative for sore throat.   Respiratory:  Negative for cough and shortness of breath.   Cardiovascular:  Negative for chest pain and leg swelling.  Gastrointestinal:  Negative for abdominal pain, diarrhea and vomiting.   Genitourinary:  Negative for dysuria and flank pain.  Musculoskeletal:  Negative for back pain and neck pain.  Skin:  Negative for rash.  Neurological:  Negative for headaches.    Physical Exam Updated Vital Signs BP (!) 162/110 (BP Location: Right Arm)   Pulse (!) 113   Temp 98.2 F (36.8 C) (Oral)   Resp 20   Ht 1.905 m (6\' 3" )   Wt 68 kg   SpO2 100%   BMI 18.75 kg/m  Physical Exam Vitals and nursing note reviewed.  Constitutional:      Appearance: Normal appearance. He is well-developed.  HENT:     Head: Atraumatic.     Nose: Nose normal.     Mouth/Throat:     Mouth: Mucous membranes are moist.     Pharynx: Oropharynx is clear.  Eyes:     General: No scleral icterus.    Conjunctiva/sclera: Conjunctivae normal.     Pupils: Pupils are equal, round, and reactive to light.  Neck:     Trachea: No tracheal deviation.     Comments: No stiffness or rigidity. Trachea midline. Thyroid not grossly enlarged or tender.  Cardiovascular:     Rate and Rhythm: Regular rhythm. Tachycardia present.     Pulses: Normal pulses.     Heart sounds: Normal heart sounds. No murmur heard.    No friction rub. No gallop.  Pulmonary:     Effort: Pulmonary effort is normal.  No accessory muscle usage or respiratory distress.     Breath sounds: Normal breath sounds.  Abdominal:     General: Bowel sounds are normal. There is no distension.     Palpations: Abdomen is soft. There is no mass.     Tenderness: There is no abdominal tenderness. There is no guarding.  Genitourinary:    Comments: No cva tenderness. Musculoskeletal:        General: No swelling or tenderness.     Cervical back: Normal range of motion and neck supple. No rigidity or tenderness.     Comments: Right great toe nail almost entirely avulsed. No active or brisk bleeding. No bone exposed. No obvious cellulitis or acute infection noted. No malodor. No devitalized tissue.   Lymphadenopathy:     Cervical: No cervical adenopathy.   Skin:    General: Skin is warm and dry.     Findings: No rash.  Neurological:     Mental Status: He is alert.     Comments: Alert, speech clear. Motor/sens grossly intact bil.   Psychiatric:        Mood and Affect: Mood normal.    ED Results / Procedures / Treatments   Labs (all labs ordered are listed, but only abnormal results are displayed) Results for orders placed or performed during the hospital encounter of 06/30/23  CBC   Collection Time: 06/30/23  6:29 PM  Result Value Ref Range   WBC 12.0 (H) 4.0 - 10.5 K/uL   RBC 5.93 (H) 4.22 - 5.81 MIL/uL   Hemoglobin 17.7 (H) 13.0 - 17.0 g/dL   HCT 14.7 82.9 - 56.2 %   MCV 85.7 80.0 - 100.0 fL   MCH 29.8 26.0 - 34.0 pg   MCHC 34.8 30.0 - 36.0 g/dL   RDW 13.0 (L) 86.5 - 78.4 %   Platelets 240 150 - 400 K/uL   nRBC 0.0 0.0 - 0.2 %  Comprehensive metabolic panel with GFR   Collection Time: 06/30/23  6:29 PM  Result Value Ref Range   Sodium 124 (L) 135 - 145 mmol/L   Potassium 2.3 (LL) 3.5 - 5.1 mmol/L   Chloride 75 (L) 98 - 111 mmol/L   CO2 34 (H) 22 - 32 mmol/L   Glucose, Bld 193 (H) 70 - 99 mg/dL   BUN 27 (H) 6 - 20 mg/dL   Creatinine, Ser 6.96 0.61 - 1.24 mg/dL   Calcium 9.4 8.9 - 29.5 mg/dL   Total Protein 8.3 (H) 6.5 - 8.1 g/dL   Albumin 4.1 3.5 - 5.0 g/dL   AST 35 15 - 41 U/L   ALT 24 0 - 44 U/L   Alkaline Phosphatase 92 38 - 126 U/L   Total Bilirubin 1.3 (H) 0.0 - 1.2 mg/dL   GFR, Estimated >28 >41 mL/min   Anion gap 15 5 - 15     EKG EKG Interpretation Date/Time:  Saturday Jun 30 2023 19:54:27 EDT Ventricular Rate:  116 PR Interval:  93 QRS Duration:  83 QT Interval:  413 QTC Calculation: 574 R Axis:   51  Text Interpretation: Sinus tachycardia U waves present Non-specific ST-t changes Confirmed by Guadalupe Lee ([id number]) on 06/30/2023 8:16:23 PM  Radiology DG Toe Great Right Result Date: 06/30/2023 CLINICAL DATA:  Right big toe injury in April. Reports toenail is following off. EXAM: RIGHT GREAT TOE  COMPARISON:  None Available. FINDINGS: Wound along the medial right great toe, at the level of the nailbed. No radiopaque foreign body. No acute  osseous abnormality. No evidence of osteolysis or erosive changes. Joint spaces appear preserved. IMPRESSION: 1. Wound along the medial right great toe, at the level of the nailbed. No radiopaque foreign body. 2. No acute osseous abnormality. Electronically Signed   By: Mannie Seek M.D.   On: 06/30/2023 18:46    Procedures Procedures    Medications Ordered in ED Medications  potassium chloride 10 mEq in 100 mL IVPB (10 mEq Intravenous New Bag/Given 06/30/23 1949)  sodium chloride 0.9 % bolus 1,000 mL (has no administration in time range)  lactated ringers bolus 1,000 mL (0 mLs Intravenous Stopped 06/30/23 1938)  potassium chloride SA (KLOR-CON M) CR tablet 40 mEq (40 mEq Oral Given 06/30/23 1950)  Tdap (BOOSTRIX) injection 0.5 mL (0.5 mLs Intramuscular Given 06/30/23 2003)    ED Course/ Medical Decision Making/ A&P                                 Medical Decision Making Problems Addressed: Avulsion of toenail, initial encounter: acute illness or injury Contusion of right great toe with damage to nail, initial encounter: acute illness or injury Dehydration: acute illness or injury with systemic symptoms that poses a threat to life or bodily functions Failure to thrive in adult: acute illness or injury with systemic symptoms that poses a threat to life or bodily functions Generalized weakness: acute illness or injury with systemic symptoms that poses a threat to life or bodily functions HIV infection, unspecified symptom status (HCC): chronic illness or injury with exacerbation, progression, or side effects of treatment that poses a threat to life or bodily functions Hypokalemia: acute illness or injury with systemic symptoms that poses a threat to life or bodily functions Hyponatremia: acute illness or injury  Amount and/or Complexity of Data  Reviewed Independent Historian: EMS    Details: hx External Data Reviewed: notes. Labs: ordered. Decision-making details documented in ED Course. Radiology: ordered and independent interpretation performed. Decision-making details documented in ED Course. ECG/medicine tests: ordered and independent interpretation performed. Decision-making details documented in ED Course. Discussion of management or test interpretation with external provider(s): hospitalists  Risk Prescription drug management. Decision regarding hospitalization.   Iv ns. Continuous pulse ox and cardiac monitoring. Labs ordered/sent. Imaging ordered.   Differential diagnosis includes dehydration, aki, electrolyte abn, etc. Dispo decision including potential need for admission considered - will get labs and imaging and reassess.   Reviewed nursing notes and prior charts for additional history. External reports reviewed. Additional history from: EMS/[facility].  Cardiac monitor: sinus rhythm, rate 110.  Labs reviewed/interpreted by me - k very low. Kcl po and iv. Continuous monitoring, ecg. Mg added. Wbc 12, hgb 17.7, ?volume depletion, ivf bolus.   Xrays reviewed/interpreted by me - no fx.  Hospitalists consulted for admission.  CRITICAL CARE RE: severe hypokalemia requiring parenteral replacement, dehydration, weakness.  Performed by: Tyqwan Pink E Alayzha An Total critical care time: 45 minutes Critical care time was exclusive of separately billable procedures and treating other patients. Critical care was necessary to treat or prevent imminent or life-threatening deterioration. Critical care was time spent personally by me on the following activities: development of treatment plan with patient and/or surrogate as well as nursing, discussions with consultants, evaluation of patient's response to treatment, examination of patient, obtaining history from patient or surrogate, ordering and performing treatments and interventions,  ordering and review of laboratory studies, ordering and review of radiographic studies, pulse oximetry and re-evaluation of patient's  condition.           Final Clinical Impression(s) / ED Diagnoses Final diagnoses:  Generalized weakness  Hypokalemia  Hyponatremia  Failure to thrive in adult  HIV infection, unspecified symptom status (HCC)  Dehydration  Contusion of right great toe with damage to nail, initial encounter  Avulsion of toenail, initial encounter    Rx / DC Orders ED Discharge Orders     None         Guadalupe Lee, MD 06/30/23 2040

## 2023-06-30 NOTE — H&P (Signed)
 History and Physical    Gerald Guzman XBJ:[id number] DOB: April 09, 1975 DOA: 06/30/2023  Patient coming from: Patient was brought from prison.  Chief Complaint: Hurting her right great toe.  HPI: Gerald Guzman is a 48 y.o. male with history of HIV who has not been taking his antiretrovirals for many years has been in jail for last 2 months and states that the last 2 to 3 days he has been feeling very weak dizzy and today he stumbled and hurt his right foot great toe while walking down the stairs.  Patient states over the last 3 days he has been placed back on his antihypertensives and his antiretroviral.  He has been having poor appetite last 3 days with nausea vomiting.  Denies any diarrhea abdominal pain or chest pain.  Denies any cough or shortness of breath.  ED Course: In the ER patient appears generally weak cachectic nonfocal.  X-ray of the right foot shows a wound and has an avulsed nail probably causing the wound.  Metabolic panel shows hyponatremia 124 and hypokalemia of 2.3.  Glucose of 193.  WBC of 12 hemoglobin 17.7.  Since patient was complaining of nausea vomiting and poor appetite CT abdomen pelvis was done which did not show anything acute in the abdomen but did show cavitary lesion in the right costophrenic angle concerning for necrotizing pneumonia/neoplasm/fungal lesions or tuberculosis.  Cultures obtained and patient was placed on airborne precautions and admitted for further workup.  After the admission patient was complaining of frontal headache and neck pain.  Review of Systems: As per HPI, rest all negative.   Past Medical History:  Diagnosis Date   Alcoholism (HCC) 02/01/2016   HIV infection (HCC)    Hypertension     Past Surgical History:  Procedure Laterality Date   HAND SURGERY       reports that he has been smoking cigarettes. He has never used smokeless tobacco. He reports current alcohol use. He reports current drug use. Drug: Marijuana.  No Known  Allergies  Family History  Problem Relation Age of Onset   Other Mother        murdered with gunshot   COPD Maternal Grandmother    Heart disease Maternal Grandmother    Other Maternal Grandfather     Prior to Admission medications   Medication Sig Start Date End Date Taking? Authorizing Provider  bictegravir-emtricitabine-tenofovir  AF (BIKTARVY) 50-200-25 MG TABS tablet Take 1 tablet by mouth daily. 04/16/17   Charolette Copier, MD  doxycycline  (VIBRAMYCIN ) 100 MG capsule Take 1 capsule (100 mg total) by mouth 2 (two) times daily. 11/02/18   Robinson, [place] N, PA-C  hydrochlorothiazide  (HYDRODIURIL ) 25 MG tablet TAKE 1 TABLET(25 MG) BY MOUTH DAILY 08/14/16   Ernie Heal, Jerelyn Money, MD    Physical Exam: Constitutional: Moderately built and nourished. Vitals:   06/30/23 2130 06/30/23 2220 06/30/23 2300 06/30/23 2348  BP: (!) 192/128  (!) 173/116 (!) 167/106  Pulse: (!) 116  (!) 107   Resp: 18  18   Temp:  98.2 F (36.8 C)    TempSrc:  Oral    SpO2: 99%  96%   Weight:      Height:       Eyes: Anicteric no pallor. ENMT: No discharge from the ears/nose or mouth. Neck: No mass felt.  No neck rigidity. Respiratory: No rhonchi or crepitations. Cardiovascular: S1-S2 heard. Abdomen: Nontender bowel sound present. Musculoskeletal: No edema. Skin: No rash. Neurologic: Alert awake oriented time place and  person.  Moves all extremities. Psychiatric: Appears normal.  Normal affect.   Labs on Admission: I have personally reviewed following labs and imaging studies  CBC: Recent Labs  Lab 06/30/23 1829  WBC 12.0*  HGB 17.7*  HCT 50.8  MCV 85.7  PLT 240   Basic Metabolic Panel: Recent Labs  Lab 06/30/23 1829  NA 124*  K 2.3*  CL 75*  CO2 34*  GLUCOSE 193*  BUN 27*  CREATININE 0.91  CALCIUM 9.4  MG 2.4   GFR: Estimated Creatinine Clearance: 95.5 mL/min (by C-G formula based on SCr of 0.91 mg/dL). Liver Function Tests: Recent Labs  Lab 06/30/23 1829  AST 35   ALT 24  ALKPHOS 92  BILITOT 1.3*  PROT 8.3*  ALBUMIN 4.1   No results for input(s): "LIPASE", "AMYLASE" in the last 168 hours. No results for input(s): "AMMONIA" in the last 168 hours. Coagulation Profile: No results for input(s): "INR", "PROTIME" in the last 168 hours. Cardiac Enzymes: No results for input(s): "CKTOTAL", "CKMB", "CKMBINDEX", "TROPONINI" in the last 168 hours. BNP (last 3 results) No results for input(s): "PROBNP" in the last 8760 hours. HbA1C: No results for input(s): "HGBA1C" in the last 72 hours. CBG: No results for input(s): "GLUCAP" in the last 168 hours. Lipid Profile: No results for input(s): "CHOL", "HDL", "LDLCALC", "TRIG", "CHOLHDL", "LDLDIRECT" in the last 72 hours. Thyroid Function Tests: No results for input(s): "TSH", "T4TOTAL", "FREET4", "T3FREE", "THYROIDAB" in the last 72 hours. Anemia Panel: No results for input(s): "VITAMINB12", "FOLATE", "FERRITIN", "TIBC", "IRON", "RETICCTPCT" in the last 72 hours. Urine analysis:    Component Value Date/Time   COLORURINE YELLOW 06/30/2023 2323   APPEARANCEUR CLEAR 06/30/2023 2323   LABSPEC 1.026 06/30/2023 2323   PHURINE 6.0 06/30/2023 2323   GLUCOSEU 50 (A) 06/30/2023 2323   HGBUR NEGATIVE 06/30/2023 2323   BILIRUBINUR NEGATIVE 06/30/2023 2323   KETONESUR NEGATIVE 06/30/2023 2323   PROTEINUR 30 (A) 06/30/2023 2323   UROBILINOGEN 0.2 01/01/2014 1050   NITRITE NEGATIVE 06/30/2023 2323   LEUKOCYTESUR NEGATIVE 06/30/2023 2323   Sepsis Labs: @LABRCNTIP (procalcitonin:4,lacticidven:4) )No results found for this or any previous visit (from the past 240 hours).   Radiological Exams on Admission: CT ABDOMEN PELVIS W CONTRAST Result Date: 06/30/2023 CLINICAL DATA:  Acute nonlocalized abdominal pain EXAM: CT ABDOMEN AND PELVIS WITH CONTRAST TECHNIQUE: Multidetector CT imaging of the abdomen and pelvis was performed using the standard protocol following bolus administration of intravenous contrast. RADIATION  DOSE REDUCTION: This exam was performed according to the departmental dose-optimization program which includes automated exposure control, adjustment of the mA and/or kV according to patient size and/or use of iterative reconstruction technique. CONTRAST:  100mL OMNIPAQUE IOHEXOL 300 MG/ML  SOLN COMPARISON:  None Available. FINDINGS: Lower chest: Focal area of infiltration with central cavitary nodule measuring 2.1 cm diameter and located in the right costophrenic angle. This could represent mass a neoplasm such as squamous cell carcinoma or it could represent an inflammatory or infectious process such as necrotizing pneumonia or atypical process such as TB or fungal infection. Hepatobiliary: No focal liver abnormality is seen. No gallstones, gallbladder wall thickening, or biliary dilatation. Pancreas: Unremarkable. No pancreatic ductal dilatation or surrounding inflammatory changes. Spleen: Normal in size without focal abnormality. Adrenals/Urinary Tract: Adrenal glands are unremarkable. Kidneys are normal, without renal calculi, focal lesion, or hydronephrosis. Bladder is unremarkable. Stomach/Bowel: Stomach, small bowel, and colon are mostly decompressed. No wall thickening or inflammatory changes are appreciated. Appendix is not identified. Vascular/Lymphatic: Aortic atherosclerosis. No enlarged abdominal  or pelvic lymph nodes. Reproductive: Prostate is unremarkable. Other: No abdominal wall hernia or abnormality. No abdominopelvic ascites. Musculoskeletal: No acute or significant osseous findings. IMPRESSION: 1. 2.1 cm diameter cavitary nodule with surrounding infiltration in the right costophrenic angle. This may represent neoplasm, necrotizing pneumonia, or atypical pneumonia such as TB or fungal disease. Consider short-term follow-up versus tissue sampling for further evaluation depending on clinical indicators. 2. No acute process demonstrated in the abdomen or pelvis. No evidence of bowel obstruction or  inflammation. 3. Aortic atherosclerosis. Electronically Signed   By: Boyce Byes M.D.   On: 06/30/2023 22:45   DG Toe Great Right Result Date: 06/30/2023 CLINICAL DATA:  Right big toe injury in April. Reports toenail is following off. EXAM: RIGHT GREAT TOE COMPARISON:  None Available. FINDINGS: Wound along the medial right great toe, at the level of the nailbed. No radiopaque foreign body. No acute osseous abnormality. No evidence of osteolysis or erosive changes. Joint spaces appear preserved. IMPRESSION: 1. Wound along the medial right great toe, at the level of the nailbed. No radiopaque foreign body. 2. No acute osseous abnormality. Electronically Signed   By: Mannie Seek M.D.   On: 06/30/2023 18:46    EKG: Independently reviewed.  Chest x-ray with U waves.  Assessment/Plan Principal Problem:   Cavitary pneumonia Active Problems:   HIV disease (HCC)   Benign essential HTN   Alcoholism (HCC)   Near syncope   Hyponatremia   Hypokalemia   Nausea & vomiting    Cavitary lung lesion -   among the differentials include necrotizing pneumonia/neoplasm/tuberculosis/fungal infection.  I have ordered blood cultures, sputum for AFB, QuantiFERON gold test, Fungitell test and place patient empirically for community-acquired pneumonia.  Will consult pulmonologist.  Patient at this time is not hypoxic.  Will check LDH and if elevated empirically cover for pneumocystis. HIV has not taken his medicines for many years now.  Will check CD4 viral count and consult infectious disease in the morning. Headache  -   patient is complaining of pain across the head after admission.  Will get CT head.  Given the HIV status may need lumbar puncture if persist even after controlling blood pressure.  Will await further opinion from infectious disease. Severe hypokalemia with EKG showing abnormal U waves.  Replace and recheck.  Could be from recent start of diuretics and also patient's poor oral  intake. Hyponatremia could be from recent use of hydrochlorothiazide .  Discontinue hydrochlorothiazide  gently hydrate follow metabolic panel.  Check urine studies TSH and cortisol. Hypertensive urgency patient was recently restarted on hydralazine amlodipine and hydrochlorothiazide  3 days ago.  Will continue amlodipine and hydralazine but hold hydrochlorothiazide  due to hyponatremia and hypokalemia.  Follow blood pressure trends. Right great toe wound  will consult wound team consult. Erythrocytosis could be from dehydration hydrate and recheck CBC.  If still persist further workup. Patient states he used to drink alcohol every day and use drugs but has not done in the last 2 months while he was in the jail.  Since patient has cavitary lung lesion with HIV status with severe hypokalemia and hyponatremia will need close monitoring further workup and more than 2 midnight stay.   DVT prophylaxis: SCDs.  If no procedure anticipated then start patient on Lovenox. Code Status: Full code. Family Communication: Discussed with patient. Disposition Plan: Progressive care. Consults called: Will consult pulmonologist. Admission status: Inpatient.

## 2023-06-30 NOTE — ED Notes (Signed)
 Writer and NT insisted pt with urinal , pt seemed very weak and urinated before urinal was placed against him. Was able to obtain a urine sample , urine sent to lab.

## 2023-06-30 NOTE — ED Notes (Signed)
 Patient transported to CT

## 2023-06-30 NOTE — ED Triage Notes (Addendum)
 Pt BIB EMS from the jail, pt reports injuring his right big toe in April. Toe nail is falling off. Pt is also complaining of back pain and seems unwell. Pt reports feeling bad for the past few days and has not been eating well.

## 2023-07-01 ENCOUNTER — Inpatient Hospital Stay (HOSPITAL_COMMUNITY)

## 2023-07-01 DIAGNOSIS — S90211A Contusion of right great toe with damage to nail, initial encounter: Secondary | ICD-10-CM | POA: Diagnosis not present

## 2023-07-01 DIAGNOSIS — J984 Other disorders of lung: Secondary | ICD-10-CM | POA: Diagnosis not present

## 2023-07-01 DIAGNOSIS — R531 Weakness: Secondary | ICD-10-CM | POA: Diagnosis not present

## 2023-07-01 DIAGNOSIS — E876 Hypokalemia: Secondary | ICD-10-CM | POA: Diagnosis not present

## 2023-07-01 DIAGNOSIS — R739 Hyperglycemia, unspecified: Secondary | ICD-10-CM | POA: Insufficient documentation

## 2023-07-01 DIAGNOSIS — B20 Human immunodeficiency virus [HIV] disease: Secondary | ICD-10-CM | POA: Diagnosis not present

## 2023-07-01 DIAGNOSIS — I38 Endocarditis, valve unspecified: Secondary | ICD-10-CM

## 2023-07-01 DIAGNOSIS — J189 Pneumonia, unspecified organism: Secondary | ICD-10-CM | POA: Diagnosis not present

## 2023-07-01 DIAGNOSIS — S91209A Unspecified open wound of unspecified toe(s) with damage to nail, initial encounter: Secondary | ICD-10-CM | POA: Diagnosis not present

## 2023-07-01 LAB — BASIC METABOLIC PANEL WITH GFR
Anion gap: 11 (ref 5–15)
Anion gap: 11 (ref 5–15)
Anion gap: 14 (ref 5–15)
BUN: 13 mg/dL (ref 6–20)
BUN: 15 mg/dL (ref 6–20)
BUN: 21 mg/dL — ABNORMAL HIGH (ref 6–20)
CO2: 31 mmol/L (ref 22–32)
CO2: 32 mmol/L (ref 22–32)
CO2: 33 mmol/L — ABNORMAL HIGH (ref 22–32)
Calcium: 8.6 mg/dL — ABNORMAL LOW (ref 8.9–10.3)
Calcium: 8.9 mg/dL (ref 8.9–10.3)
Calcium: 9.1 mg/dL (ref 8.9–10.3)
Chloride: 79 mmol/L — ABNORMAL LOW (ref 98–111)
Chloride: 81 mmol/L — ABNORMAL LOW (ref 98–111)
Chloride: 82 mmol/L — ABNORMAL LOW (ref 98–111)
Creatinine, Ser: 0.52 mg/dL — ABNORMAL LOW (ref 0.61–1.24)
Creatinine, Ser: 0.65 mg/dL (ref 0.61–1.24)
Creatinine, Ser: 0.78 mg/dL (ref 0.61–1.24)
GFR, Estimated: >60 mL/min (ref >60)
GFR, Estimated: >60 mL/min (ref >60)
GFR, Estimated: >60 mL/min (ref >60)
Glucose, Bld: 129 mg/dL — ABNORMAL HIGH (ref 70–99)
Glucose, Bld: 134 mg/dL — ABNORMAL HIGH (ref 70–99)
Glucose, Bld: 140 mg/dL — ABNORMAL HIGH (ref 70–99)
Potassium: 2.6 mmol/L — CL (ref 3.5–5.1)
Potassium: 2.8 mmol/L — ABNORMAL LOW (ref 3.5–5.1)
Potassium: 2.8 mmol/L — ABNORMAL LOW (ref 3.5–5.1)
Sodium: 123 mmol/L — ABNORMAL LOW (ref 135–145)
Sodium: 125 mmol/L — ABNORMAL LOW (ref 135–145)
Sodium: 126 mmol/L — ABNORMAL LOW (ref 135–145)

## 2023-07-01 LAB — LACTATE DEHYDROGENASE: LDH: 179 U/L (ref 98–192)

## 2023-07-01 LAB — RAPID URINE DRUG SCREEN, HOSP PERFORMED
Amphetamines: NOT DETECTED
Barbiturates: NOT DETECTED
Benzodiazepines: NOT DETECTED
Cocaine: NOT DETECTED
Opiates: NOT DETECTED
Tetrahydrocannabinol: NOT DETECTED

## 2023-07-01 LAB — HEMOGLOBIN A1C
Hgb A1c MFr Bld: 6.1 % — ABNORMAL HIGH (ref 4.8–5.6)
Mean Plasma Glucose: 128.37 mg/dL

## 2023-07-01 LAB — MRSA NEXT GEN BY PCR, NASAL: MRSA by PCR Next Gen: NOT DETECTED

## 2023-07-01 LAB — CBC WITH DIFFERENTIAL/PLATELET
Abs Immature Granulocytes: 0.06 10*3/uL (ref 0.00–0.07)
Basophils Absolute: 0 10*3/uL (ref 0.0–0.1)
Basophils Relative: 0 %
Eosinophils Absolute: 0 10*3/uL (ref 0.0–0.5)
Eosinophils Relative: 0 %
HCT: 50.8 % (ref 39.0–52.0)
Hemoglobin: 17.5 g/dL — ABNORMAL HIGH (ref 13.0–17.0)
Immature Granulocytes: 1 %
Lymphocytes Relative: 5 %
Lymphs Abs: 0.5 10*3/uL — ABNORMAL LOW (ref 0.7–4.0)
MCH: 29.2 pg (ref 26.0–34.0)
MCHC: 34.4 g/dL (ref 30.0–36.0)
MCV: 84.8 fL (ref 80.0–100.0)
Monocytes Absolute: 0.6 10*3/uL (ref 0.1–1.0)
Monocytes Relative: 6 %
Neutro Abs: 8.7 10*3/uL — ABNORMAL HIGH (ref 1.7–7.7)
Neutrophils Relative %: 88 %
Platelets: 223 10*3/uL (ref 150–400)
RBC: 5.99 MIL/uL — ABNORMAL HIGH (ref 4.22–5.81)
RDW: 10.9 % — ABNORMAL LOW (ref 11.5–15.5)
WBC: 9.8 10*3/uL (ref 4.0–10.5)
nRBC: 0 % (ref 0.0–0.2)

## 2023-07-01 LAB — ECHOCARDIOGRAM COMPLETE
AR max vel: 2.89 cm2
AV Area VTI: 2.61 cm2
AV Area mean vel: 3.02 cm2
AV Mean grad: 3.6 mmHg
AV Peak grad: 7.6 mmHg
Ao pk vel: 1.38 m/s
Area-P 1/2: 5.84 cm2
Calc EF: 67.7 %
Height: 75 in
S' Lateral: 2.6 cm
Single Plane A2C EF: 70.9 %
Single Plane A4C EF: 62.5 %
Weight: 2400 [oz_av]

## 2023-07-01 LAB — EXPECTORATED SPUTUM ASSESSMENT W GRAM STAIN, RFLX TO RESP C

## 2023-07-01 LAB — MAGNESIUM: Magnesium: 2 mg/dL (ref 1.7–2.4)

## 2023-07-01 LAB — OSMOLALITY: Osmolality: 269 mosm/kg — ABNORMAL LOW (ref 275–295)

## 2023-07-01 LAB — CORTISOL: Cortisol, Plasma: 30.3 ug/dL

## 2023-07-01 LAB — SODIUM, URINE, RANDOM: Sodium, Ur: 110 mmol/L

## 2023-07-01 LAB — TSH: TSH: 0.972 u[IU]/mL (ref 0.350–4.500)

## 2023-07-01 LAB — OSMOLALITY, URINE: Osmolality, Ur: 599 mosm/kg (ref 300–900)

## 2023-07-01 MED ORDER — POTASSIUM CHLORIDE CRYS ER 20 MEQ PO TBCR
60.0000 meq | EXTENDED_RELEASE_TABLET | ORAL | Status: AC
  Administered 2023-07-01 (×2): 60 meq via ORAL
  Filled 2023-07-01 (×2): qty 3

## 2023-07-01 MED ORDER — SODIUM CHLORIDE 0.9 % IV SOLN
2.0000 g | INTRAVENOUS | Status: DC
  Administered 2023-07-01: 2 g via INTRAVENOUS
  Filled 2023-07-01: qty 20

## 2023-07-01 MED ORDER — AMOXICILLIN-POT CLAVULANATE 875-125 MG PO TABS
1.0000 | ORAL_TABLET | Freq: Two times a day (BID) | ORAL | Status: DC
  Administered 2023-07-01: 1 via ORAL
  Filled 2023-07-01: qty 1

## 2023-07-01 MED ORDER — KETOROLAC TROMETHAMINE 30 MG/ML IJ SOLN
30.0000 mg | Freq: Four times a day (QID) | INTRAMUSCULAR | Status: AC | PRN
  Filled 2023-07-01: qty 1

## 2023-07-01 MED ORDER — LABETALOL HCL 5 MG/ML IV SOLN
5.0000 mg | INTRAVENOUS | Status: DC | PRN
  Administered 2023-07-01 – 2023-07-04 (×12): 5 mg via INTRAVENOUS
  Filled 2023-07-01 (×9): qty 4

## 2023-07-01 MED ORDER — IOHEXOL 300 MG/ML  SOLN
75.0000 mL | Freq: Once | INTRAMUSCULAR | Status: AC | PRN
  Administered 2023-07-01: 75 mL via INTRAVENOUS

## 2023-07-01 MED ORDER — ACETAMINOPHEN 325 MG PO TABS
650.0000 mg | ORAL_TABLET | Freq: Four times a day (QID) | ORAL | Status: DC | PRN
  Administered 2023-07-01 – 2023-07-05 (×4): 650 mg via ORAL
  Filled 2023-07-01 (×3): qty 2

## 2023-07-01 MED ORDER — SODIUM CHLORIDE 0.9 % IV SOLN
500.0000 mg | INTRAVENOUS | Status: DC
  Administered 2023-07-01: 500 mg via INTRAVENOUS
  Filled 2023-07-01: qty 5

## 2023-07-01 MED ORDER — HYDRALAZINE HCL 20 MG/ML IJ SOLN
5.0000 mg | INTRAMUSCULAR | Status: DC | PRN
  Administered 2023-07-01 – 2023-07-04 (×3): 5 mg via INTRAVENOUS
  Filled 2023-07-01 (×3): qty 1

## 2023-07-01 MED ORDER — PNEUMOCOCCAL 20-VAL CONJ VACC 0.5 ML IM SUSY
0.5000 mL | PREFILLED_SYRINGE | INTRAMUSCULAR | Status: AC
  Administered 2023-07-02: 0.5 mL via INTRAMUSCULAR
  Filled 2023-07-01: qty 0.5

## 2023-07-01 MED ORDER — LIDOCAINE 5 % EX PTCH
1.0000 | MEDICATED_PATCH | Freq: Once | CUTANEOUS | Status: AC
  Administered 2023-07-01: 1 via TRANSDERMAL
  Filled 2023-07-01: qty 1

## 2023-07-01 MED ORDER — POTASSIUM CHLORIDE 20 MEQ PO PACK
40.0000 meq | PACK | ORAL | Status: AC
  Administered 2023-07-01 (×2): 40 meq via ORAL
  Filled 2023-07-01 (×2): qty 2

## 2023-07-01 MED ORDER — MUPIROCIN 2 % EX OINT
TOPICAL_OINTMENT | Freq: Two times a day (BID) | CUTANEOUS | Status: DC
  Administered 2023-07-03 – 2023-07-05 (×2): 1 via TOPICAL
  Filled 2023-07-01 (×6): qty 22

## 2023-07-01 MED ORDER — METOPROLOL TARTRATE 25 MG PO TABS
25.0000 mg | ORAL_TABLET | Freq: Two times a day (BID) | ORAL | Status: DC
  Administered 2023-07-01 (×2): 25 mg via ORAL
  Filled 2023-07-01 (×2): qty 1

## 2023-07-01 MED ORDER — HYDRALAZINE HCL 25 MG PO TABS
25.0000 mg | ORAL_TABLET | Freq: Three times a day (TID) | ORAL | Status: DC
  Administered 2023-07-01 – 2023-07-02 (×4): 25 mg via ORAL
  Filled 2023-07-01 (×4): qty 1

## 2023-07-01 MED ORDER — AZITHROMYCIN 250 MG PO TABS: 500.0000 mg | ORAL_TABLET | Freq: Every day | ORAL | Status: DC

## 2023-07-01 MED ORDER — ENSURE PLUS HIGH PROTEIN PO LIQD
237.0000 mL | Freq: Two times a day (BID) | ORAL | Status: DC
  Administered 2023-07-01: 237 mL via ORAL

## 2023-07-01 NOTE — Progress Notes (Signed)
 Progress Note   Patient: Gerald Guzman NWG:[id number] DOB: 1975/04/06 DOA: 06/30/2023     1 DOS: the patient was seen and examined on 07/01/2023   Brief hospital course: 48 y.o. male with history of HIV who has not been taking his antiretrovirals for many years has been in jail for last 2 months and states that the last 2 to 3 days he has been feeling very weak dizzy and today he stumbled and hurt his right foot great toe while walking down the stairs.  Patient states over the last 3 days he has been placed back on his antihypertensives and his antiretroviral.  He has been having poor appetite last 3 days with nausea vomiting.   In the ER patient appears generally weak cachectic nonfocal. X-ray of the right foot shows a wound and has an avulsed nail probably causing the wound. Metabolic panel shows hyponatremia 124 and hypokalemia of 2.3. Glucose of 193. WBC of 12 hemoglobin 17.7. Since patient was complaining of nausea vomiting and poor appetite CT abdomen pelvis was done which did not show anything acute in the abdomen but did show cavitary lesion in the right costophrenic angle concerning for necrotizing pneumonia/neoplasm/fungal lesions or tuberculosis. Cultures obtained and patient was placed on airborne precautions and admitted for further workup   Assessment and Plan:   Cavitary lung lesion -    Concerns for pneumonia/neoplasm/tuberculosis/fungal infection.   F/u on blood cultures, sputum for AFB, QuantiFERON gold test, Fungitell test  Cont empiric abx Pulm following, will benefit from bronch with EBUS HIV  Reportedlly has not taken his medicines for many years now.  F/u CD4 and viral load ID consulted Headache Head CT reviewed, normal Severe hypokalemia  EKG showing abnormal U waves.   Cont to follow trend in lytes and correct as needed Hyponatremia    Discontinue hydrochlorothiazide   Serum osm, urine osm, urine na pending Hypertensive urgency  patient was recently restarted on  hydralazine amlodipine and hydrochlorothiazide  3 days ago.   Cont amlodipine and hydralazine but hold hydrochlorothiazide  due to hyponatremia and hypokalemia. Have added metoprolol secondary to sinus tach Right great toe wound   WOC consulted Erythrocytosis  Cont to follow cbc Sinus tach Cont to correct lytes per above Started metprolol 25mg  bid Diffuse fluid filled esophagus with circumferential wall thickening Concerns for inflammatory vs infectious esophagitis Will consult GI   Subjective: Denies chest pain or sob  Physical Exam: Vitals:   07/01/23 0123 07/01/23 0254 07/01/23 0900 07/01/23 1124  BP: (!) 187/121 (!) 186/103 (!) 161/105 (!) 158/108  Pulse: (!) 103 (!) 117 (!) 104 (!) 122  Resp: 20  17 (!) 22  Temp: 97.8 F (36.6 C)  97.7 F (36.5 C) 98.1 F (36.7 C)  TempSrc: Oral  Oral Oral  SpO2: 100%  100% 99%  Weight:      Height:       General exam: Awake, laying in bed, in nad Respiratory system: Normal respiratory effort, no wheezing Cardiovascular system: regular rate, s1, s2 Gastrointestinal system: Soft, nondistended, positive BS Central nervous system: CN2-12 grossly intact, strength intact Extremities: Perfused, no clubbing Skin: Normal skin turgor, no notable skin lesions seen Psychiatry: Mood normal // no visual hallucinations   Data Reviewed:  Labs reviewed: Na 123, K 2.6, Cr 0.65, WBC 9.8, Hgb 17.5  Family Communication: Pt in room, family not at bedside  Disposition: Status is: Inpatient Remains inpatient appropriate because: severity of illness  Planned Discharge Destination: Unknown    Author: Cherylle Corwin, MD  07/01/2023 2:48 PM  For on call review [email or web address].

## 2023-07-01 NOTE — Consult Note (Signed)
 WOC Nurse Consult Note: Reason for Consult: R great toe wound  Wound type: full thickness appears to be r/t trauma with ? Partial nail bed removal  Pressure Injury POA: NA not pressure  Measurement: see nursing flowsheet  Wound bed: red moist  Drainage (amount, consistency, odor)  Periwound: Dressing procedure/placement/frequency: Cleanse R great toe/toenail bed with NS, apply Mupirocin ointment to wound bed 2 times daily, cover with Telfa nonstick dressing and tape or silicone foam whichever is preferred.    POC discussed with bedside nurse.  Patient would benefit from visit to podiatry for ongoing care however access may be an issue.  Per ED notes patient was previously referred to [facility] and [facility] for PCP.   WOC team will not follow. Re-consult if further needs arise.     Thank you,    Ronni Colace MSN, RN-BC, [employer] [phone]

## 2023-07-01 NOTE — Progress Notes (Signed)
   07/01/23 1408  TOC Brief Assessment  Insurance and Status Reviewed  Patient has primary care physician Yes  Home environment has been reviewed [facility]  Prior level of function: Independent  Prior/Current Home Services No current home services  Social Drivers of Health Review SDOH reviewed no interventions necessary  Readmission risk has been reviewed Yes  Transition of care needs no transition of care needs at this time    [place], MSW, LCSW 07/01/2023 2:08 PM

## 2023-07-01 NOTE — Hospital Course (Signed)
 47 yom w/ HIV who has not been taking his antiretrovirals for many years has been in jail for last 2 months, feeling very weak dizzy 2-3 days PTA and he stumbled and hurt his right foot great toe while walking down the stairs presented to ED. Of note for last 3 days PTA he has been placed back on his antihypertensives and antiretroviral but having poor appetite last 3 days with nausea vomiting.  In the ER,  patient appears generally weak cachectic nonfocal. X-ray of the right foot shows a wound and has an avulsed nail probably causing the wound. Metabolic panel shows hyponatremia 124 and hypokalemia of 2.3. Glucose of 193. WBC of 12 hemoglobin 17.7. Since patient was complaining of nausea vomiting and poor appetite CT abdomen pelvis was seen in rapid response due to hypoxia which did not show anything acute in the abdomen but did show cavitary lesion in the right costophrenic angle concerning for necrotizing pneumonia/neoplasm/fungal lesions or tuberculosis. Cultures obtained and patient was placed on airborne precautions and admitted for further workup  Patient underwent LP and also underwent bronchoscopy Due to worsening hypoxia transferred to stepdown 6/5, taken off TB precaution  Subjective: Patient seen and examined this morning Remains lethargic nods for response Nursing reports he refused LP: I discussed with him extensively for the need to lower his opening pressure-he is agreeable Overnight remains tachycardic BP stable oxygen down to 2 L-needed up to nonrebreather and HFNC Labs with mild hyponatremia hyperkalemia stable renal function stable CBC with mild thrombocytopenia Core track tube +  Assessment and plan:  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 Follow-up BAL culture data and cytology.  QuantiFERON gold is negative PCP is negative, taken off of TB precaution. Pulmonary ID following-holding ART for now 6/5 transferred to stepdown due to worsening hypoxia. Now on nasal cannula oxygen mentation appears poor. Continue IV Amphotericin, flucytosine via core track, IV Unasyn, supportive care multivitamins He needs repeat LP due to high opening pressure per ID and IR will be informed again this morning-as patient is agreeable Plan is for repeat LP under fluoroscopy due to high opening pressure and follow-up cryptococcal and other CSF studies 6/5  Acute encephalopathy due to patient's meningitis.  Appears weak frail minimally vocalizing continue core track, supportive care delirium precaution   Esophageal thickening Oral thrush: GI following closely plan for endoscopy before discharge. Continue nystatin  Severe hypokalemia  Potassium improving continue to replace  Hyponatremia : Improving, HCTZ discontinued. 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    Hypertensive urgency  Sinus tachycardia-in the setting of AIDS/cryptococcal meningitis bacteremia: BP borderline controlled> change Lopressor via core track, continue as needed labetalol and hydralazine  Right great toe wound: Cont wound care  Erythrocytosis: Monitor labs.

## 2023-07-01 NOTE — Progress Notes (Signed)
  Echocardiogram 2D Echocardiogram has been performed.  Gerald Guzman 07/01/2023, 2:49 PM

## 2023-07-01 NOTE — Consult Note (Addendum)
   NAME:  Gerald Guzman, MRN:  [id number], DOB:  03/16/1975, LOS: 1 ADMISSION DATE:  06/30/2023, CONSULTATION DATE: 07/01/2023 REFERRING MD: Dr. Joan Mouton, CHIEF COMPLAINT: Cavitary lung disease  History of Present Illness:  Patient was brought in from the prison system  Her histo  Was complaining of some abdominal pain, did have abdominal CT showing a right lower lobe cavitary process Had a CT scan of the chest showing cavitary process in the right lower lobe cavitary process, right middle lobe cavitary mass, subcarinal adenopathy  He is very sleepy and not very interactive at present  Was not having any respiratory complaints  History of HIV disease, has not been compliant with treatment  Significant Hospital Events: Including procedures, antibiotic start and stop dates in addition to other pertinent events   CT 07/01/2023-right middle lobe cavitary process, right lower lobe cavitary process with subcarinal adenopathy  Interim History / Subjective:  Admitted with pain discomfort Very sleepy at present  Objective    Blood pressure (!) 158/108, pulse (!) 122, temperature 98.1 F (36.7 C), temperature source Oral, resp. rate (!) 22, height 6\' 3"  (1.905 m), weight 68 kg, SpO2 99%.        Intake/Output Summary (Last 24 hours) at 07/01/2023 1147 Last data filed at 07/01/2023 2130 Gross per 24 hour  Intake 1770 ml  Output --  Net 1770 ml   Filed Weights   06/30/23 1752  Weight: 68 kg    Examination: General: Middle-age, does not appear to be in distress HENT:  Lungs: Clear breath sounds bilaterally Cardiovascular: S1-S2 appreciated Abdomen: Soft, bowel sounds appreciated   Reviewed CT scan myself -Cavitary process in right middle lobe, right lower lobe cavitary process Resolved problem list   Assessment and Plan   Cavitary lung disease  HIV disease noncompliant with treatment  Possible history of aspiration pneumonia, cavitary lung cancer, rule out tuberculosis, rule out  fungal etiology  Will benefit from bronchoscopy with EBUS to sample subcarinal adenopathy  Esophageal abnormality on CT scan will need further workup as well  Hypokalemia - Being corrected  Follow QuantiFERON gold Sputum for Gram stain and cultures, sputum for AFB if able to provide sputum  Pulmonary will continue to follow and make arrangements for bronchoscopy

## 2023-07-01 NOTE — Consult Note (Signed)
 [facility]    Date of Admission:  06/30/2023     Reason for Consult: hiv    Referring Provider: Joan Mouton     Lines:  Peripheral iv's  Abx: 6/01-c amox-clav 5/31-c azith (5 day planned)  5/31-6/1 ceftriaxone         Assessment: 48 yo male hiv last seen in [facility] 2018 of antiviral for many years, brought in from jail (where he had stayed for the last 2 months) with a few days poor appetite, n/v, weakness/dizziness and a ground level fall, with ED workup showing cavitary pna/lung mass  On presentation and so far: Afebrile No leukocytosis    #pulm mass/cavitary changes Agree ddx infection (fungal, tb; polymicrobial pna), cancer (lymphoma/primary lung adenocarcinoma); certainly more than one process can be playing roles  Hypnatremic in setting lungs/dizziness, but as mentioned fungal infection such as crypto with cns involvement can be in play  OI such as nocardia a consideration for bacterial pna  Doesn't look like pjp    #headache #dizziness Head ct negative Serum crypto/histo sent Tomorrow would ask id team to arrange LP   #thickend esophagus Ddx cancer/kaposi/fungal infection including histo Gi consult pending   #hiv Haven't taken art since several years ago Will send basic hiv labs to see where he is with cd4    Micro 6/1 bcx ngtd 6/1 mtb pcr sputum in process 6/1 quantiferon gold in process 6/1 fungitel in process   Plan: Agree with tb r/o; could be primary progressive tb  Would do augmentin for superimposed bacterial pna; can finish 5 days azith order urine legionella ag Agree with pulm that a bronchoscopy with biopsy would be helpful Afb sputum x3 with rifampin pcr in process order sputum cx as well since nocardia could be playing a role Crypto, histo, blasto ordered Holding ART until crypto r/o; head ct negative but I am wary that if he has crypto or tb meningitis will need to delay starting art If crypto  ag negative, would proceed with LP for further diagnostic testing Thickened esophagus -- primary team had consulted GI Maintain standard isolation precaution Discussed with primary team     ------------------------------------------------ Principal Problem:   Cavitary pneumonia Active Problems:   HIV disease (HCC)   Benign essential HTN   Alcoholism (HCC)   Near syncope   Hyponatremia   Hypokalemia   Nausea & vomiting   Hyperglycemia    HPI: Gerald Guzman is a 47 y.o. male currently in jail, known hiv (2019 cd4 320/18%) off art for several years, admitted from jail for several weeks progressive weakness, poor appetite, dizziness/headache, and found to have pulm mass/cavitary changes  Discussed with patient and the guard He has tb screen prior to jail and was never told anything positive  Last seen dr Ernie Heal 2018 -- controlled hiv with genvoya  No other complaint outside of above Afebrile on presentation Chest ct as mentioned lung/gi changes Started on azith/ceftriaxone   Pulm/gi consulted Rifampin/mtb pcr from sputum ordered. Pulm planning bronch   Patient smokes and drinks  No other complaint   Family History  Problem Relation Age of Onset   Other Mother        murdered with gunshot   COPD Maternal Grandmother    Heart disease Maternal Grandmother    Other Maternal Grandfather     Social History   Tobacco Use   Smoking status: Every Day    Current packs/day: 1.00  Types: Cigarettes   Smokeless tobacco: Never   Tobacco comments:    "I'll just stop them when I'm ready, but not yet"  Substance Use Topics   Alcohol use: Yes    Alcohol/week: 0.0 standard drinks of alcohol    Comment: 2-3 40 oz beers a day   Drug use: Yes    Types: Marijuana    No Known Allergies  Review of Systems: ROS All Other ROS was negative, except mentioned above   Past Medical History:  Diagnosis Date   Alcoholism (HCC) 02/01/2016   HIV infection (HCC)     Hypertension        Scheduled Meds:  amLODipine  10 mg Oral Daily   [START ON 07/02/2023] azithromycin   500 mg Oral Daily   feeding supplement  237 mL Oral BID BM   hydrALAZINE  25 mg Oral Q8H   lidocaine  1 patch Transdermal Once   metoprolol tartrate  25 mg Oral BID   mupirocin ointment   Topical BID   [START ON 07/02/2023] pneumococcal 20-valent conjugate vaccine  0.5 mL Intramuscular Tomorrow-1000   Continuous Infusions:  cefTRIAXone  (ROCEPHIN )  IV 2 g (07/01/23 0623)   lactated ringers 75 mL/hr at 07/01/23 0017   PRN Meds:.acetaminophen , hydrALAZINE, labetalol   OBJECTIVE: Blood pressure (!) 158/108, pulse (!) 122, temperature 98.1 F (36.7 C), temperature source Oral, resp. rate (!) 22, height 6\' 3"  (1.905 m), weight 68 kg, SpO2 99%.  Physical Exam General/constitutional: intermittent wetsounding weak cough, conversant, no distress HEENT: Normocephalic, PER, Conj Clear, EOMI, Oropharynx clear Neck supple CV: rrr no mrg Lungs: course breath sound;  normal respiratory effort Abd: Soft, Nontender Ext: no edema Skin: No Rash Neuro: nonfocal MSK: no peripheral joint swelling/tenderness/warmth; back spines nontender     Lab Results Lab Results  Component Value Date   WBC 9.8 07/01/2023   HGB 17.5 (H) 07/01/2023   HCT 50.8 07/01/2023   MCV 84.8 07/01/2023   PLT 223 07/01/2023    Lab Results  Component Value Date   CREATININE 0.65 07/01/2023   BUN 15 07/01/2023   NA 123 (L) 07/01/2023   K 2.6 (LL) 07/01/2023   CL 81 (L) 07/01/2023   CO2 31 07/01/2023    Lab Results  Component Value Date   ALT 24 06/30/2023   AST 35 06/30/2023   ALKPHOS 92 06/30/2023   BILITOT 1.3 (H) 06/30/2023      Microbiology: Recent Results (from the past 240 hours)  Culture, blood (Routine X 2) w Reflex to ID Panel     Status: None (Preliminary result)   Collection Time: 07/01/23 12:15 AM   Specimen: BLOOD  Result Value Ref Range Status   Specimen Description   Final    BLOOD  LEFT ANTECUBITAL Performed at [facility], [address], [place], [place] [address]    Special Requests   Final    BOTTLES DRAWN AEROBIC AND ANAEROBIC Blood Culture adequate volume Performed at [facility], [address] [place], [place] [address]    Culture   Final    NO GROWTH < 12 HOURS Performed at [facility], [address], [place], [place] [address]    Report Status PENDING  Incomplete  Culture, blood (Routine X 2) w Reflex to ID Panel     Status: None (Preliminary result)   Collection Time: 07/01/23 12:15 AM   Specimen: BLOOD LEFT FOREARM  Result Value Ref Range Status   Specimen Description   Final  BLOOD LEFT FOREARM Performed at [facility], [address], [place], [place] [address]    Special Requests   Final    BOTTLES DRAWN AEROBIC AND ANAEROBIC Blood Culture adequate volume Performed at [facility], [address], [place], [place] [address]    Culture   Final    NO GROWTH < 12 HOURS Performed at [facility], [address] [place], [place] [address]    Report Status PENDING  Incomplete     Serology:    Imaging: If present, new imagings (plain films, ct scans, and mri) have been personally visualized and interpreted; radiology reports have been reviewed. Decision making incorporated into the Impression / Recommendations.  6/1 chest ct 1. Multilobar cavitary mass in the right middle lobe, measuring 3.9 x 5.6 x 2.6 cm with a couple of distal satellite cavitary nodules (for example, axial 87). The cavitary nodule in the right lower lobe is otherwise unchanged with adjacent tree-in-bud nodularity. While a cavitary squamous cell carcinoma remains in the differential, given the tree-in-bud nodularity, this has the appearance of a multifocal cavitary pneumonia, including TB or fungal etiologies, with endobronchial spread. Aspiration pneumonia remains in the differential.  Bronchoscopic sampling recommended. 2. Diffuse fluid-filled esophagus with circumferential wall thickening, worrisome for an infectious or inflammatory esophagitis. Nonemergent upper endoscopy also recommended. 3. Enlarged right hilar and mediastinal lymph nodes, possibly reactive. The subcarinal and right hilar lymph nodes should be amenable to sampling during bronchoscopy, as clinically warranted.  6/1 head ct Normal noncontrast Head CT.   5/31 abd pelv ct 1. 2.1 cm diameter cavitary nodule with surrounding infiltration in the right costophrenic angle. This may represent neoplasm, necrotizing pneumonia, or atypical pneumonia such as TB or fungal disease. Consider short-term follow-up versus tissue sampling for further evaluation depending on clinical indicators. 2. No acute process demonstrated in the abdomen or pelvis. No evidence of bowel obstruction or inflammation. 3. Aortic atherosclerosis.  Jamesetta Mcbride, MD [facility] [phone] pager    07/01/2023, 3:36 PM

## 2023-07-01 NOTE — Plan of Care (Signed)
 BP and HR continue to be high and require PRN medications to treat.  Multiple urine and sputum samples sent, will advise oncoming nurse of further samples to be collected.

## 2023-07-01 DEATH — deceased

## 2023-07-02 ENCOUNTER — Inpatient Hospital Stay (HOSPITAL_COMMUNITY)

## 2023-07-02 DIAGNOSIS — S91209A Unspecified open wound of unspecified toe(s) with damage to nail, initial encounter: Secondary | ICD-10-CM

## 2023-07-02 DIAGNOSIS — R531 Weakness: Secondary | ICD-10-CM | POA: Diagnosis not present

## 2023-07-02 DIAGNOSIS — R918 Other nonspecific abnormal finding of lung field: Secondary | ICD-10-CM

## 2023-07-02 DIAGNOSIS — J984 Other disorders of lung: Secondary | ICD-10-CM | POA: Diagnosis not present

## 2023-07-02 DIAGNOSIS — B20 Human immunodeficiency virus [HIV] disease: Secondary | ICD-10-CM | POA: Diagnosis not present

## 2023-07-02 DIAGNOSIS — R7881 Bacteremia: Secondary | ICD-10-CM | POA: Diagnosis not present

## 2023-07-02 DIAGNOSIS — S90211A Contusion of right great toe with damage to nail, initial encounter: Secondary | ICD-10-CM | POA: Diagnosis not present

## 2023-07-02 DIAGNOSIS — J189 Pneumonia, unspecified organism: Secondary | ICD-10-CM | POA: Diagnosis not present

## 2023-07-02 DIAGNOSIS — B451 Cerebral cryptococcosis: Secondary | ICD-10-CM | POA: Diagnosis not present

## 2023-07-02 DIAGNOSIS — B957 Other staphylococcus as the cause of diseases classified elsewhere: Secondary | ICD-10-CM

## 2023-07-02 LAB — BLOOD CULTURE ID PANEL (REFLEXED) - BCID2
A.calcoaceticus-baumannii: NOT DETECTED
Bacteroides fragilis: NOT DETECTED
Candida albicans: NOT DETECTED
Candida auris: NOT DETECTED
Candida glabrata: NOT DETECTED
Candida krusei: NOT DETECTED
Candida parapsilosis: NOT DETECTED
Candida tropicalis: NOT DETECTED
Cryptococcus neoformans/gattii: DETECTED — AB
Enterobacter cloacae complex: NOT DETECTED
Enterobacterales: NOT DETECTED
Enterococcus Faecium: NOT DETECTED
Enterococcus faecalis: NOT DETECTED
Escherichia coli: NOT DETECTED
Haemophilus influenzae: NOT DETECTED
Klebsiella aerogenes: NOT DETECTED
Klebsiella oxytoca: NOT DETECTED
Klebsiella pneumoniae: NOT DETECTED
Listeria monocytogenes: NOT DETECTED
Neisseria meningitidis: NOT DETECTED
Proteus species: NOT DETECTED
Pseudomonas aeruginosa: NOT DETECTED
Salmonella species: NOT DETECTED
Serratia marcescens: NOT DETECTED
Staphylococcus aureus (BCID): NOT DETECTED
Staphylococcus epidermidis: NOT DETECTED
Staphylococcus lugdunensis: NOT DETECTED
Staphylococcus species: DETECTED — AB
Stenotrophomonas maltophilia: NOT DETECTED
Streptococcus agalactiae: NOT DETECTED
Streptococcus pneumoniae: NOT DETECTED
Streptococcus pyogenes: NOT DETECTED
Streptococcus species: NOT DETECTED

## 2023-07-02 LAB — BLOOD GAS, ARTERIAL
Acid-Base Excess: 7.7 mmol/L — ABNORMAL HIGH (ref 0.0–2.0)
Bicarbonate: 31 mmol/L — ABNORMAL HIGH (ref 20.0–28.0)
Drawn by: 72603
O2 Content: 2 L/min
O2 Saturation: 95.1 %
Patient temperature: 37.9
pCO2 arterial: 40 mmHg (ref 32–48)
pH, Arterial: 7.51 — ABNORMAL HIGH (ref 7.35–7.45)
pO2, Arterial: 65 mmHg — ABNORMAL LOW (ref 83–108)

## 2023-07-02 LAB — BASIC METABOLIC PANEL WITH GFR
Anion gap: 14 (ref 5–15)
BUN: 15 mg/dL (ref 6–20)
CO2: 26 mmol/L (ref 22–32)
Calcium: 9.2 mg/dL (ref 8.9–10.3)
Chloride: 86 mmol/L — ABNORMAL LOW (ref 98–111)
Creatinine, Ser: 0.78 mg/dL (ref 0.61–1.24)
GFR, Estimated: >60 mL/min (ref >60)
Glucose, Bld: 170 mg/dL — ABNORMAL HIGH (ref 70–99)
Potassium: 3.3 mmol/L — ABNORMAL LOW (ref 3.5–5.1)
Sodium: 126 mmol/L — ABNORMAL LOW (ref 135–145)

## 2023-07-02 LAB — T-HELPER CELLS (CD4) COUNT (NOT AT ARMC)
CD4 % Helper T Cell: 5 % — ABNORMAL LOW (ref 33–65)
CD4 T Cell Abs: <35 /uL — ABNORMAL LOW (ref 400–1790)

## 2023-07-02 LAB — PROTEIN AND GLUCOSE, CSF
Glucose, CSF: 45 mg/dL (ref 40–70)
Total  Protein, CSF: 34 mg/dL (ref 15–45)

## 2023-07-02 LAB — MAGNESIUM: Magnesium: 1.8 mg/dL (ref 1.7–2.4)

## 2023-07-02 MED ORDER — MEPERIDINE HCL 25 MG/ML IJ SOLN: 25.0000 mg | INTRAMUSCULAR | Status: DC | PRN

## 2023-07-02 MED ORDER — DEXTROSE 5% FOR FLUSHING BEFORE AND AFTER AMBISOME
10.0000 mL | INTRAVENOUS | Status: DC
  Administered 2023-07-02 – 2023-07-07 (×6): 10 mL via INTRAVENOUS
  Filled 2023-07-02 (×6): qty 10

## 2023-07-02 MED ORDER — DEXTROSE 5 % IV SOLN
250.0000 mg | INTRAVENOUS | Status: DC
  Filled 2023-07-02: qty 2.5

## 2023-07-02 MED ORDER — LEVALBUTEROL HCL 0.63 MG/3ML IN NEBU
0.6300 mg | INHALATION_SOLUTION | Freq: Four times a day (QID) | RESPIRATORY_TRACT | Status: DC | PRN
  Administered 2023-07-05: 0.63 mg via RESPIRATORY_TRACT
  Filled 2023-07-02 (×2): qty 3

## 2023-07-02 MED ORDER — SODIUM CHLORIDE 0.9 % IV BOLUS FOR AMBISOME
500.0000 mL | INTRAVENOUS | Status: DC
  Administered 2023-07-02 – 2023-07-07 (×6): 500 mL via INTRAVENOUS

## 2023-07-02 MED ORDER — METOPROLOL TARTRATE 5 MG/5ML IV SOLN
5.0000 mg | Freq: Four times a day (QID) | INTRAVENOUS | Status: DC
  Administered 2023-07-02 – 2023-07-04 (×7): 5 mg via INTRAVENOUS
  Filled 2023-07-02 (×7): qty 5

## 2023-07-02 MED ORDER — POTASSIUM CHLORIDE 10 MEQ/100ML IV SOLN
10.0000 meq | INTRAVENOUS | Status: AC
  Administered 2023-07-02 (×5): 10 meq via INTRAVENOUS
  Filled 2023-07-02 (×5): qty 100

## 2023-07-02 MED ORDER — DIPHENHYDRAMINE HCL 50 MG/ML IJ SOLN
25.0000 mg | Freq: Every day | INTRAMUSCULAR | Status: DC | PRN
  Administered 2023-07-04 – 2023-07-06 (×2): 25 mg via INTRAVENOUS
  Filled 2023-07-02 (×2): qty 1

## 2023-07-02 MED ORDER — DIPHENHYDRAMINE HCL 25 MG PO CAPS: 25.0000 mg | ORAL_CAPSULE | Freq: Every day | ORAL | Status: DC | PRN

## 2023-07-02 MED ORDER — DEXTROSE 5% FOR FLUSHING BEFORE AND AFTER AMBISOME
10.0000 mL | INTRAVENOUS | Status: DC
  Administered 2023-07-02 – 2023-07-07 (×6): 10 mL via INTRAVENOUS
  Filled 2023-07-02 (×6): qty 10
  Filled 2023-07-02: qty 250

## 2023-07-02 MED ORDER — FLUCYTOSINE 500 MG PO CAPS
25.0000 mg/kg | ORAL_CAPSULE | Freq: Four times a day (QID) | ORAL | Status: DC
  Filled 2023-07-02: qty 1

## 2023-07-02 MED ORDER — DEXTROSE 5 % IV SOLN
3.0000 mg/kg | INTRAVENOUS | Status: DC
  Administered 2023-07-02 – 2023-07-07 (×6): 200 mg via INTRAVENOUS
  Filled 2023-07-02 (×2): qty 50
  Filled 2023-07-02: qty 37.5
  Filled 2023-07-02 (×3): qty 50

## 2023-07-02 MED ORDER — PANTOPRAZOLE SODIUM 40 MG IV SOLR
40.0000 mg | INTRAVENOUS | Status: DC
  Administered 2023-07-02 – 2023-07-07 (×6): 40 mg via INTRAVENOUS
  Filled 2023-07-02 (×6): qty 10

## 2023-07-02 MED ORDER — ACETAMINOPHEN 650 MG RE SUPP: 650.0000 mg | Freq: Once | RECTAL | Status: DC

## 2023-07-02 MED ORDER — PIPERACILLIN-TAZOBACTAM 3.375 G IVPB: 3.3750 g | Freq: Three times a day (TID) | INTRAVENOUS | Status: DC

## 2023-07-02 MED ORDER — ACETAMINOPHEN 325 MG PO TABS
650.0000 mg | ORAL_TABLET | Freq: Every day | ORAL | Status: DC | PRN
  Filled 2023-07-02: qty 2

## 2023-07-02 MED ORDER — FLUCYTOSINE 500 MG PO CAPS
1500.0000 mg | ORAL_CAPSULE | Freq: Four times a day (QID) | ORAL | Status: DC
  Filled 2023-07-02 (×2): qty 3

## 2023-07-02 MED ORDER — MAGNESIUM SULFATE 2 GM/50ML IV SOLN
2.0000 g | Freq: Once | INTRAVENOUS | Status: AC
  Administered 2023-07-02: 2 g via INTRAVENOUS
  Filled 2023-07-02: qty 50

## 2023-07-02 MED ORDER — POTASSIUM CHLORIDE 20 MEQ PO PACK: 40.0000 meq | PACK | Freq: Once | ORAL | Status: DC

## 2023-07-02 MED ORDER — SODIUM CHLORIDE 0.9 % IV SOLN
3.0000 g | Freq: Four times a day (QID) | INTRAVENOUS | Status: DC
  Administered 2023-07-02 – 2023-07-07 (×20): 3 g via INTRAVENOUS
  Filled 2023-07-02 (×20): qty 8

## 2023-07-02 MED ORDER — FLUCYTOSINE 500 MG PO CAPS
2000.0000 mg | ORAL_CAPSULE | Freq: Four times a day (QID) | ORAL | Status: DC
  Administered 2023-07-05 (×2): 2000 mg via ORAL
  Filled 2023-07-02 (×13): qty 4

## 2023-07-02 NOTE — Consult Note (Addendum)
 Referring Provider: [facility] Primary Care Physician:  Pcp, No Primary Gastroenterologist: Para Bold Reason for Consultation: Esophagitis  HPI: Gerald Guzman is a 48 y.o. male with past medical history of HIV with noncompliant on treatment, history of alcohol use, history of incarceration brought into the emergency department on May 31 from jail for right big toe injury and poor appetite.  Initial blood work showed normal hemoglobin, normal LFTs, low potassium of 2.3, normal TSH, negative urine drug screen.  CT abdomen pelvis with IV contrast showed no acute GI changes but showed 2.1 cm cavitary nodule in the right costophrenic angle concerning for TB or fungal infection.  Follow-up CT chest showed multilobular cavitary lesion measuring up to 5.6 cm.  Also showed diffuse fluid-filled esophagus with circumferential wall thickening.  GI is consulted for possible EGD.  Patient seen and examined at bedside.  RN at bedside.  He is lethargic and not able to provide any history.   Past Medical History:  Diagnosis Date   Alcoholism (HCC) 02/01/2016   HIV infection (HCC)    Hypertension     Past Surgical History:  Procedure Laterality Date   HAND SURGERY      Prior to Admission medications   Medication Sig Start Date End Date Taking? Authorizing Provider  amLODipine (NORVASC) 10 MG tablet Take 10 mg by mouth daily.   Yes [provider]  hydrALAZINE (APRESOLINE) 25 MG tablet Take 25 mg by mouth 3 (three) times daily.   Yes [provider]  hydrochlorothiazide  (HYDRODIURIL ) 50 MG tablet Take 50 mg by mouth daily.   Yes [provider]    Scheduled Meds:  dextrose  10 mL Intravenous Q24H   dextrose  10 mL Intravenous Q24H   flucytosine  1,500 mg Oral Q6H   metoprolol tartrate  5 mg Intravenous Q6H   mupirocin ointment   Topical BID   pneumococcal 20-valent conjugate vaccine  0.5 mL Intramuscular Tomorrow-1000   sodium chloride  500 mL Intravenous Q24H   sodium  chloride  500 mL Intravenous Q24H   Continuous Infusions:  amphotericin B liposome (AMBISOME) 200 mg in dextrose 5 % 500 mL IVPB 200 mg (07/02/23 0455)   azithromycin      magnesium sulfate bolus IVPB     potassium chloride     PRN Meds:.acetaminophen , acetaminophen , diphenhydrAMINE **OR** diphenhydrAMINE, hydrALAZINE, ketorolac, labetalol, levalbuterol, meperidine (DEMEROL) injection  Allergies as of 06/30/2023   (No Known Allergies)    Family History  Problem Relation Age of Onset   Other Mother        murdered with gunshot   COPD Maternal Grandmother    Heart disease Maternal Grandmother    Other Maternal Grandfather     Social History   Socioeconomic History   Marital status: Single    Spouse name: Not on file   Number of children: Not on file   Years of education: Not on file   Highest education level: Not on file  Occupational History   Not on file  Tobacco Use   Smoking status: Every Day    Current packs/day: 1.00    Types: Cigarettes   Smokeless tobacco: Never   Tobacco comments:    "I'll just stop them when I'm ready, but not yet"  Substance and Sexual Activity   Alcohol use: Yes    Alcohol/week: 0.0 standard drinks of alcohol    Comment: 2-3 40 oz beers a day   Drug use: Yes    Types: Marijuana   Sexual activity: Not  Currently    Partners: Female    Birth control/protection: Condom    Comment: declined condoms  Other Topics Concern   Not on file  Social History Narrative   Not on file   Social Drivers of Health   Financial Resource Strain: Not on file  Food Insecurity: Patient Declined (07/01/2023)   Hunger Vital Sign    Worried About Running Out of Food in the Last Year: Patient declined    Ran Out of Food in the Last Year: Patient declined  Transportation Needs: Not on file  Physical Activity: Not on file  Stress: Not on file  Social Connections: Not on file  Intimate Partner Violence: Not on file    Review of Systems: All negative except  as stated above in HPI.  Physical Exam: Vital signs: Vitals:   07/02/23 0443 07/02/23 0500  BP:    Pulse:    Resp:    Temp: 98.3 F (36.8 C) 100.3 F (37.9 C)  SpO2:     Last BM Date : 07/02/23 General:   Lethargic, only open eyes to verbal command but not able to provide any history Lungs: No visible respiratory distress Heart:  Regular rate and rhythm; no murmurs, clicks, rubs,  or gallops. Abdomen: Soft, nontender, nondistended, bowel sound present, not able to appreciate tenderness because of lethargy but no peritoneal signs Rectal:  Deferred  GI:  Lab Results: Recent Labs    06/30/23 1829 07/01/23 0015  WBC 12.0* 9.8  HGB 17.7* 17.5*  HCT 50.8 50.8  PLT 240 223   BMET Recent Labs    07/01/23 0840 07/01/23 1602 07/02/23 0353  NA 123* 125* 126*  K 2.6* 2.8* 3.3*  CL 81* 82* 86*  CO2 31 32 26  GLUCOSE 134* 140* 170*  BUN 15 13 15   CREATININE 0.65 0.52* 0.78  CALCIUM 8.6* 8.9 9.2   LFT Recent Labs    06/30/23 1829  PROT 8.3*  ALBUMIN 4.1  AST 35  ALT 24  ALKPHOS 92  BILITOT 1.3*   PT/INR No results for input(s): "LABPROT", "INR" in the last 72 hours.   Studies/Results: DG Chest Port 1 View Result Date: 07/02/2023 CLINICAL DATA:  Hypoxia. EXAM: PORTABLE CHEST 1 VIEW COMPARISON:  06/30/2023 FINDINGS: Stable cardiomediastinal contours. No pleural fluid or interstitial edema. Cavitary consolidative change within the right lower lobe is again noted as seen on the CT from 06/30/2023. Left lung appears clear. Visualized osseous structures are intact. IMPRESSION: Cavitary consolidative change within the right lower lobe as seen on the CT from 06/30/2023. Imaging findings compatible with necrotizing pneumonia versus atypical pneumonia such as TB or fungal disease. Recommend follow-up imaging to ensure resolution and to exclude underlying malignancy. Electronically Signed   By: Kimberley Penman M.D.   On: 07/02/2023 05:48   ECHOCARDIOGRAM COMPLETE Result Date:  07/01/2023    ECHOCARDIOGRAM REPORT   Patient Name:   MYCHAEL SMOCK Date of Exam: 07/01/2023 Medical Rec #:  [id number]       Height:       75.0 in Accession #:    [id number]      Weight:       150.0 lb Date of Birth:  1975-12-14      BSA:          1.942 m Patient Age:    47 years        BP:           158/108 mmHg Patient Gender: M  HR:           102 bpm. Exam Location:  Inpatient Procedure: 2D Echo, Color Doppler and Cardiac Doppler (Both Spectral and Color            Flow Doppler were utilized during procedure). Indications:    Endocarditis.  History:        Patient has no prior history of Echocardiogram examinations.                 Signs/Symptoms:Syncope, Dizziness/Lightheadedness and Shortness                 of Breath; Risk Factors:Hypertension and Current Smoker. HIV.                 ETOH.  Sonographer:    Raynelle Callow RDCS Referring Phys: [id number] STEPHEN K CHIU  Sonographer Comments: Technically difficult study due to poor echo windows. Patient with very thin habitus, rolled supine during exam. IMPRESSIONS  1. Left ventricular ejection fraction, by estimation, is 60 to 65%. The left ventricle has normal function. The left ventricle has no regional wall motion abnormalities. Indeterminate diastolic filling due to E-A fusion.  2. Right ventricular systolic function is normal. The right ventricular size is normal. Tricuspid regurgitation signal is inadequate for assessing PA pressure.  3. Large pleural effusion.  4. The mitral valve is grossly normal. No evidence of mitral valve regurgitation. No evidence of mitral stenosis.  5. The aortic valve was not well visualized. Aortic valve regurgitation is not visualized. No aortic stenosis is present.  6. The inferior vena cava is normal in size with greater than 50% respiratory variability, suggesting right atrial pressure of 3 mmHg. Comparison(s): No prior Echocardiogram. FINDINGS  Left Ventricle: Left ventricular ejection fraction, by estimation, is 60 to  65%. The left ventricle has normal function. The left ventricle has no regional wall motion abnormalities. The left ventricular internal cavity size was normal in size. There is  no left ventricular hypertrophy. Indeterminate diastolic filling due to E-A fusion. Right Ventricle: The right ventricular size is normal. Right vetricular wall thickness was not well visualized. Right ventricular systolic function is normal. Tricuspid regurgitation signal is inadequate for assessing PA pressure. Left Atrium: Left atrial size was normal in size. Right Atrium: Right atrial size was normal in size. Pericardium: There is no evidence of pericardial effusion. Mitral Valve: The mitral valve is grossly normal. There is mild thickening of the anterior and posterior mitral valve leaflet(s). No evidence of mitral valve regurgitation. No evidence of mitral valve stenosis. Tricuspid Valve: The tricuspid valve is normal in structure. Tricuspid valve regurgitation is not demonstrated. No evidence of tricuspid stenosis. Aortic Valve: The aortic valve was not well visualized. Aortic valve regurgitation is not visualized. No aortic stenosis is present. Aortic valve mean gradient measures 3.6 mmHg. Aortic valve peak gradient measures 7.6 mmHg. Aortic valve area, by VTI measures 2.61 cm. Pulmonic Valve: The pulmonic valve was not well visualized. Pulmonic valve regurgitation is not visualized. No evidence of pulmonic stenosis. Aorta: The aortic root and ascending aorta are structurally normal, with no evidence of dilitation. Venous: The inferior vena cava is normal in size with greater than 50% respiratory variability, suggesting right atrial pressure of 3 mmHg. IAS/Shunts: No atrial level shunt detected by color flow Doppler. Additional Comments: There is a large pleural effusion.  LEFT VENTRICLE PLAX 2D LVIDd:         4.00 cm     Diastology LVIDs:  2.60 cm     LV e' medial:    5.55 cm/s LV PW:         0.90 cm     LV E/e' medial:   14.0 LV IVS:        1.00 cm     LV e' lateral:   9.68 cm/s LVOT diam:     2.10 cm     LV E/e' lateral: 8.0 LV SV:         62 LV SV Index:   32 LVOT Area:     3.46 cm  LV Volumes (MOD) LV vol d, MOD A2C: 72.5 ml LV vol d, MOD A4C: 90.7 ml LV vol s, MOD A2C: 21.1 ml LV vol s, MOD A4C: 34.0 ml LV SV MOD A2C:     51.4 ml LV SV MOD A4C:     90.7 ml LV SV MOD BP:      56.5 ml RIGHT VENTRICLE             IVC RV S prime:     11.20 cm/s  IVC diam: 1.30 cm TAPSE (M-mode): 2.6 cm LEFT ATRIUM             Index        RIGHT ATRIUM           Index LA diam:        2.50 cm 1.29 cm/m   RA Area:     11.90 cm LA Vol (A2C):   16.5 ml 8.50 ml/m   RA Volume:   28.70 ml  14.78 ml/m LA Vol (A4C):   19.7 ml 10.14 ml/m LA Biplane Vol: 19.5 ml 10.04 ml/m  AORTIC VALVE AV Area (Vmax):    2.89 cm AV Area (Vmean):   3.02 cm AV Area (VTI):     2.61 cm AV Vmax:           137.91 cm/s AV Vmean:          87.563 cm/s AV VTI:            0.237 m AV Peak Grad:      7.6 mmHg AV Mean Grad:      3.6 mmHg LVOT Vmax:         115.00 cm/s LVOT Vmean:        76.400 cm/s LVOT VTI:          0.179 m LVOT/AV VTI ratio: 0.75  AORTA Ao Root diam: 3.90 cm Ao Asc diam:  3.00 cm MITRAL VALVE MV Area (PHT): 5.84 cm     SHUNTS MV Decel Time: 130 msec     Systemic VTI:  0.18 m MV E velocity: 77.90 cm/s   Systemic Diam: 2.10 cm MV A velocity: 127.00 cm/s MV E/A ratio:  0.61 Armida Lander MD Electronically signed by Armida Lander MD Signature Date/Time: 07/01/2023/4:26:08 PM    Final    CT CHEST W CONTRAST Result Date: 07/01/2023 CLINICAL DATA:  Pneumonia, complication suspected, xray done EXAM: CT ANGIOGRAPHY CHEST WITH CONTRAST TECHNIQUE: Multidetector CT imaging of the chest was performed using the standard protocol during bolus administration of intravenous contrast. Multiplanar CT image reconstructions and MIPs were obtained to evaluate the vascular anatomy. RADIATION DOSE REDUCTION: This exam was performed according to the departmental dose-optimization  program which includes automated exposure control, adjustment of the mA and/or kV according to patient size and/or use of iterative reconstruction technique. CONTRAST:  75mL OMNIPAQUE IOHEXOL 300 MG/ML  SOLN COMPARISON:  Jun 30, 2023  FINDINGS: Pulmonary Embolism: While the exam was not optimized for the evaluation of the pulmonary arteries, no central pulmonary embolism visualized. Cardiovascular: No cardiomegaly or pericardial effusion. No aortic aneurysm. Mediastinum/Nodes: No mediastinal mass. Enlarged right hilar lymph nodes measuring up to 1.1 cm. Launch subcarinal lymph node measuring 1.9 cm (axial 76). Diffuse fluid-filled dilation of the esophagus. Lungs/Pleura: The midline trachea and bronchi are patent. Multilobular cavitary mass in the right middle lobe, measuring 3.9 x 5.6 x 2.6 cm (axial 80), with a couple of distal regions of satellite cavitary nodules (axial 87). Thick-walled cavitary lesion with tree-in-bud nodularity again noted in the right lower lobe (axial 121). The cavitary nodule measures 1.5 x 1.9 x 1.1 cm. No lobar consolidation, pleural effusion, or pneumothorax. Musculoskeletal: No acute fracture or destructive bone lesion. Multilevel thoracic osteophytosis. Upper Abdomen: No acute abnormality in the partially visualized upper abdomen. Review of the MIP images confirms the above findings. IMPRESSION: 1. Multilobar cavitary mass in the right middle lobe, measuring 3.9 x 5.6 x 2.6 cm with a couple of distal satellite cavitary nodules (for example, axial 87). The cavitary nodule in the right lower lobe is otherwise unchanged with adjacent tree-in-bud nodularity. While a cavitary squamous cell carcinoma remains in the differential, given the tree-in-bud nodularity, this has the appearance of a multifocal cavitary pneumonia, including TB or fungal etiologies, with endobronchial spread. Aspiration pneumonia remains in the differential. Bronchoscopic sampling recommended. 2. Diffuse fluid-filled  esophagus with circumferential wall thickening, worrisome for an infectious or inflammatory esophagitis. Nonemergent upper endoscopy also recommended. 3. Enlarged right hilar and mediastinal lymph nodes, possibly reactive. The subcarinal and right hilar lymph nodes should be amenable to sampling during bronchoscopy, as clinically warranted. Electronically Signed   By: Rance Burrows M.D.   On: 07/01/2023 08:43   CT HEAD WO CONTRAST ( ) Result Date: 07/01/2023 CLINICAL DATA:  47 year old male with headache.  Malaise. EXAM: CT HEAD WITHOUT CONTRAST TECHNIQUE: Contiguous axial images were obtained from the base of the skull through the vertex without intravenous contrast. RADIATION DOSE REDUCTION: This exam was performed according to the departmental dose-optimization program which includes automated exposure control, adjustment of the mA and/or kV according to patient size and/or use of iterative reconstruction technique. COMPARISON:  Head CT 05/02/2004. FINDINGS: Brain: Cerebral volume remains normal. No midline shift, ventriculomegaly, mass effect, evidence of mass lesion, intracranial hemorrhage or evidence of cortically based acute infarction. Gray-white matter differentiation is within normal limits throughout the brain. Small cavum septum pellucidum, normal variant. Vascular: No suspicious intracranial vascular hyperdensity. Skull: Intact.  No acute osseous abnormality identified. Sinuses/Orbits: Visualized paranasal sinuses and mastoids are stable and overall well aerated. Other: Visualized orbits and scalp soft tissues are within normal limits. IMPRESSION: Normal noncontrast Head CT. Electronically Signed   By: Marlise Simpers M.D.   On: 07/01/2023 08:35   CT ABDOMEN PELVIS W CONTRAST Result Date: 06/30/2023 CLINICAL DATA:  Acute nonlocalized abdominal pain EXAM: CT ABDOMEN AND PELVIS WITH CONTRAST TECHNIQUE: Multidetector CT imaging of the abdomen and pelvis was performed using the standard protocol following  bolus administration of intravenous contrast. RADIATION DOSE REDUCTION: This exam was performed according to the departmental dose-optimization program which includes automated exposure control, adjustment of the mA and/or kV according to patient size and/or use of iterative reconstruction technique. CONTRAST:  100mL OMNIPAQUE IOHEXOL 300 MG/ML  SOLN COMPARISON:  None Available. FINDINGS: Lower chest: Focal area of infiltration with central cavitary nodule measuring 2.1 cm diameter and located in the right costophrenic angle. This could  represent mass a neoplasm such as squamous cell carcinoma or it could represent an inflammatory or infectious process such as necrotizing pneumonia or atypical process such as TB or fungal infection. Hepatobiliary: No focal liver abnormality is seen. No gallstones, gallbladder wall thickening, or biliary dilatation. Pancreas: Unremarkable. No pancreatic ductal dilatation or surrounding inflammatory changes. Spleen: Normal in size without focal abnormality. Adrenals/Urinary Tract: Adrenal glands are unremarkable. Kidneys are normal, without renal calculi, focal lesion, or hydronephrosis. Bladder is unremarkable. Stomach/Bowel: Stomach, small bowel, and colon are mostly decompressed. No wall thickening or inflammatory changes are appreciated. Appendix is not identified. Vascular/Lymphatic: Aortic atherosclerosis. No enlarged abdominal or pelvic lymph nodes. Reproductive: Prostate is unremarkable. Other: No abdominal wall hernia or abnormality. No abdominopelvic ascites. Musculoskeletal: No acute or significant osseous findings. IMPRESSION: 1. 2.1 cm diameter cavitary nodule with surrounding infiltration in the right costophrenic angle. This may represent neoplasm, necrotizing pneumonia, or atypical pneumonia such as TB or fungal disease. Consider short-term follow-up versus tissue sampling for further evaluation depending on clinical indicators. 2. No acute process demonstrated in the  abdomen or pelvis. No evidence of bowel obstruction or inflammation. 3. Aortic atherosclerosis. Electronically Signed   By: Boyce Byes M.D.   On: 06/30/2023 22:45   DG Toe Great Right Result Date: 06/30/2023 CLINICAL DATA:  Right big toe injury in April. Reports toenail is following off. EXAM: RIGHT GREAT TOE COMPARISON:  None Available. FINDINGS: Wound along the medial right great toe, at the level of the nailbed. No radiopaque foreign body. No acute osseous abnormality. No evidence of osteolysis or erosive changes. Joint spaces appear preserved. IMPRESSION: 1. Wound along the medial right great toe, at the level of the nailbed. No radiopaque foreign body. 2. No acute osseous abnormality. Electronically Signed   By: Mannie Seek M.D.   On: 06/30/2023 18:46    Impression/Plan: -Abnormal CT scan concerning for esophagitis in a patient with finding concerning for large cavitary lung lesion as well as cryptococcal meningitis. - History of HIV.  Noncompliant with medications - Encephalopathy. -Unknown etiology. ?  Infectious.  Recommendations ------------------------- - Recommend workup for cavitary lung lesion and encephalopathy prior to doing endoscopy.  We may consider EGD prior to discharge.   - Start Protonix  IV 40 mg once a day -GI will follow periodically.    LOS: 2 days   Felecia Hopper  MD, FACP 07/02/2023, 9:51 AM  Contact #  [phone]

## 2023-07-02 NOTE — Progress Notes (Signed)
 Progress Note   Patient: Gerald Guzman RUE:[id number] DOB: 17-Mar-1975 DOA: 06/30/2023     2 DOS: the patient was seen and examined on 07/02/2023   Brief hospital course: 48 y.o. male with history of HIV who has not been taking his antiretrovirals for many years has been in jail for last 2 months and states that the last 2 to 3 days he has been feeling very weak dizzy and today he stumbled and hurt his right foot great toe while walking down the stairs.  Patient states over the last 3 days he has been placed back on his antihypertensives and his antiretroviral.  He has been having poor appetite last 3 days with nausea vomiting.   In the ER patient appears generally weak cachectic nonfocal. X-ray of the right foot shows a wound and has an avulsed nail probably causing the wound. Metabolic panel shows hyponatremia 124 and hypokalemia of 2.3. Glucose of 193. WBC of 12 hemoglobin 17.7. Since patient was complaining of nausea vomiting and poor appetite CT abdomen pelvis was done which did not show anything acute in the abdomen but did show cavitary lesion in the right costophrenic angle concerning for necrotizing pneumonia/neoplasm/fungal lesions or tuberculosis. Cultures obtained and patient was placed on airborne precautions and admitted for further workup   Assessment and Plan:   Cavitary lung lesion -    Concerns for pneumonia/neoplasm/tuberculosis/fungal infection.   F/u on blood cultures, sputum for AFB, QuantiFERON gold test, Fungitell test  Cont empiric abx Pulm following, will benefit from bronch with EBUS HIV  Reportedlly has not taken his medicines for many years now.  CD4 low at <35 with viral load pending ID consulted. 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 Pt appears altered. LP performed today, pending results Headache Head CT reviewed, normal S/p LP, penidng results Severe hypokalemia  EKG showing abnormal U waves.   Cont to follow trend in lytes and correct as needed Hyponatremia    Discontinue  hydrochlorothiazide   Serum osm, urine osm, urine na pending Hypertensive urgency  patient was recently restarted on hydralazine amlodipine and hydrochlorothiazide  3 days ago.   Cont amlodipine and hydralazine but hold hydrochlorothiazide  due to hyponatremia and hypokalemia. Have added metoprolol secondary to sinus tach Right great toe wound   WOC consulted Erythrocytosis  Cont to follow cbc Sinus tach Cont to correct lytes per above Started metprolol 25mg  bid Diffuse fluid filled esophagus with circumferential wall thickening Concerns for inflammatory vs infectious esophagitis GI consulted. Recs for EGD prior to d/c Possible Aspiration PNA Aspiration event noted overnight Cont abx   Subjective: Difficult to assess given mentation  Physical Exam: Vitals:   07/02/23 0443 07/02/23 0500 07/02/23 1135 07/02/23 1600  BP:   (!) 157/108 (!) 147/96  Pulse:   (!) 127 (!) 125  Resp:   20   Temp: 98.3 F (36.8 C) 100.3 F (37.9 C) 98.8 F (37.1 C) 99.1 F (37.3 C)  TempSrc: Oral Rectal Oral Oral  SpO2:   95% 98%  Weight:      Height:       General exam: Conversant, in no acute distress Respiratory system: normal chest rise, clear, no audible wheezing Cardiovascular system: regular rhythm, s1-s2 Gastrointestinal system: Nondistended, nontender, pos BS Central nervous system: No seizures, no tremors Extremities: No cyanosis, no joint deformities Skin: No rashes, no pallor Psychiatry: Affect normal // no auditory hallucinations    Data Reviewed:  Labs reviewed: Na 126, K 3.3, Cr 0.78   Family Communication: Pt in room, family not at  bedside  Disposition: Status is: Inpatient Remains inpatient appropriate because: severity of illness  Planned Discharge Destination: Unknown    Author: Cherylle Corwin, MD 07/02/2023 6:20 PM  For on call review [email or web address].

## 2023-07-02 NOTE — Progress Notes (Addendum)
   NAME:  Gerald Guzman, MRN:  [id number], DOB:  1975-08-16, LOS: 2 ADMISSION DATE:  06/30/2023, CONSULTATION DATE: 07/01/2023 REFERRING MD: Dr. Joan Mouton, CHIEF COMPLAINT: Cavitary lung disease  History of Present Illness:  Patient was brought in from the prison system   Her histo   Was complaining of some abdominal pain, did have abdominal CT showing a right lower lobe cavitary process Had a CT scan of the chest showing cavitary process in the right lower lobe cavitary process, right middle lobe cavitary mass, subcarinal adenopathy   He is very sleepy and not very interactive at present Was not having any respiratory complaints History of HIV disease, has not been compliant with treatment  Pertinent  Medical History   Past Medical History:  Diagnosis Date   Alcoholism (HCC) 02/01/2016   HIV infection (HCC)    Hypertension    Significant Hospital Events: Including procedures, antibiotic start and stop dates in addition to other pertinent events   CT 07/01/2023-right middle lobe cavitary process, right lower lobe cavitary process with subcarinal adenopathy  Interim History / Subjective:  Admitted with pain discomfort Lethargic Objective    Blood pressure (!) 132/98, pulse (!) 124, temperature 100.3 F (37.9 C), temperature source Rectal, resp. rate (!) 28, height 6\' 3"  (1.905 m), weight 68 kg, SpO2 95%.        Intake/Output Summary (Last 24 hours) at 07/02/2023 0858 Last data filed at 07/02/2023 0033 Gross per 24 hour  Intake 240 ml  Output 500 ml  Net -260 ml   Filed Weights   06/30/23 1752  Weight: 68 kg    Examination: General: Middle-age, lethargic HENT: Dry oral mucosa Lungs: Occasional rhonchi Cardiovascular: S1-S2 appreciated Abdomen: Soft, bowel sounds appreciated  Resolved problem list   Assessment and Plan  Cavitary lung disease HIV disease, noncompliant with treatment  Possible history of aspiration pneumonia, cavitary lung cancer, rule out reticulosis,  rule out other fungal etiologies  Will benefit from bronchoscopy with EBUS to sample subcarinal adenopathy - Tentatively scheduling for 07/04/2023-has to be last case of the day because of TB rule out  Follow QuantiFERON gold  Sputum for Gram stain and culture sputum for AFB if able to provide sputum  With his degree of lethargy,- -possibility of a CNS infection comes into play - 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

## 2023-07-02 NOTE — Progress Notes (Signed)
 Pharmacy Antibiotic Note  Gerald Guzman is a 48 y.o. male admitted on 06/30/2023 with cavitary lung lesion with concern for pna/neoplasm/tuberculosis/fungal infection.  PMH significant for HIV.  Pharmacy has been consulted for Ambisome and Flucytosine dosing.  Plan: Ambisome 200mg  (3mg /kg) IV q24h Flucytosine 1500mg  po q6h  Height: 6\' 3"  (190.5 cm) Weight: 68 kg (150 lb) IBW/kg (Calculated) : 84.5  Temp (24hrs), Avg:97.9 F (36.6 C), Min:97.4 F (36.3 C), Max:98.2 F (36.8 C)  Recent Labs  Lab 06/30/23 1829 07/01/23 0015 07/01/23 0840 07/01/23 1602  WBC 12.0* 9.8  --   --   CREATININE 0.91 0.78 0.65 0.52*    Estimated Creatinine Clearance: 108.6 mL/min (A) (by C-G formula based on SCr of 0.52 mg/dL (L)).    No Known Allergies  Antimicrobials this admission: 6/1 Ceftriaxone  x 1 6/1 Augmentin >>   6/1 Azithromycin  >> 6/2 Ambisome >> 6/2 Flucytosine >>  Dose adjustments this admission:    Microbiology results: 6/1 BCx: Staph species (1/4), Crytococcal neoformans/gattii 6/1 Sputum:    6/1 MRSA PCR: negative  Thank you for allowing pharmacy to be a part of this patient's care.  Rulon Councilman, PharmD 07/02/2023 3:37 AM

## 2023-07-02 NOTE — Progress Notes (Signed)
 SLP Cancellation Note  Patient Details Name: Gerald Guzman MRN: [id number] DOB: 20-Dec-1975   Cancelled treatment:       Reason Eval/Treat Not Completed: Patient's level of consciousness SLP attempted bedside swallow evaluation however unable to rouse patient despite tactile stimulation, sternal rub and loud voice. He would very briefly open his eyes when SLP requested but unable to sustain alertness. SLP able to perform limited oral care in anterior portion of oral cavity with suction toothbrush but patient would not open mouth. Police officer Catus in room and she has been with patient since yesterday. She told SLP that previous date he was able to eat some cereal, drink juice and was feeding himself but today he has been asleep and not waking up. She feels that he has declined from yesterday. SLP will follow for readiness. Recommend to continue strict NPO.  Jacqualine Mater, MA, CCC-SLP Speech Therapy

## 2023-07-02 NOTE — Procedures (Addendum)
 PROCEDURE SUMMARY:  Successful fluoroscopic guided lumbar puncture.  Opening pressure was 62+ cm H2O - opening pressure exceeded the maximum height of manometer   ~16 mL cloudy, colorless cerebrospinal fluid collected and sent for labs.  No immediate complications.  Pt tolerated well.   EBL = none  Please see full dictation in imaging section of Epic for procedure details.  Electronically signed by: Pasty Bongo PA-C 07/02/2023 3:34 PM

## 2023-07-02 NOTE — Progress Notes (Signed)
 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  Staph capitis as well which is likely contamination. - CT chest showed multilobar cavitary mass in right middle lobe measuring up to 5.6 cm with distal satellite cavitary nodules.  Diffuse fluid-filled esophagus with separation wall thickening. - Serum cryptococcal antigen titer>2560 -She has been on antibiotics for the pulmonary mass, TB workup AFB sputum x 3 pending -TTE no veg #Esophageal thickening - GI consulted who recommended workup for cavitary lung lesion and encephalopathy prior to doing endoscopy.  GI to follow periodically.  May consider EGD prior to discharge.  #HIV/AIDS -<35, HIV RNA pending - Currently holding ART Recommendations: -Continue Amphotericin and flucytosine - Repeat blood cultures to ensure clearance - Recommend LP please obtain opening pressure along with AFB, fungal, patient is still altered.  Cryptococcal antigen and bacterial cultures -Continue Unasyn, follow respiratory cultures - Follow TAfb sputum x3 with rifampin pcr  - Holding off ART therapy - Agree with pulmonology in regards to obtaining bronchoscopy.  Tentatively scheduled on 6/4 as last case because of TB rule out.  Recommend AFB, fungal,  bacterial cultures [facility] pathology. -Airborne cautions given TB work up  Evaluation of this patient requires complex antimicrobial therapy evaluation and counseling + isolation needs for disease transmission risk assessment and mitigation   Microbiology:   Antibiotics: Augmentin 6/1 Unasyn 6/2 Hypothyroid Versed 6/1-present Cultures: Blood Stick/1 BC ID Cryptococcus neoformans and Staphylococcus species Blood cultures growing 1/4 Staph capitis, 1/4 GPR lines Urine  Other   SUBJECTIVE: Resting in bed. Unable to answer questions appropriately Interval: Afebrile overnight. Wbc 9.8k  Review of Systems: Review of Systems  All other systems reviewed and are negative.    Scheduled Meds:  dextrose  10 mL Intravenous Q24H   dextrose  10 mL Intravenous Q24H   flucytosine  2,000 mg Oral Q6H   metoprolol tartrate  5 mg Intravenous Q6H   mupirocin ointment   Topical BID   pantoprazole  (PROTONIX ) IV  40 mg Intravenous Q24H   sodium chloride  500 mL Intravenous Q24H   sodium chloride  500 mL Intravenous Q24H   Continuous Infusions:  amphotericin B liposome (AMBISOME) 200 mg in dextrose 5 % 500 mL IVPB 200 mg (07/02/23 0455)   ampicillin-sulbactam (UNASYN) IV 3 g (07/02/23 1153)   potassium chloride 10 mEq (07/02/23 1245)   PRN Meds:.acetaminophen , acetaminophen , diphenhydrAMINE **OR** diphenhydrAMINE, hydrALAZINE, ketorolac, labetalol, levalbuterol, meperidine (DEMEROL) injection No Known Allergies  OBJECTIVE: Vitals:   07/02/23 0413 07/02/23 0443 07/02/23 0500 07/02/23 1135  BP: (!) 132/98   (!) 157/108  Pulse: (!) 124   (!) 127  Resp: (!) 28   20  Temp:  98.3 F (36.8 C) 100.3 F (37.9 C)  98.8 F (37.1 C)  TempSrc:  Oral Rectal Oral  SpO2: 95%   95%  Weight:      Height:       Body mass index is 18.75 kg/m.  Physical Exam Constitutional:      General: He is not in acute distress.    Appearance: He is normal weight.  HENT:     Head: Normocephalic and  atraumatic.     Right Ear: External ear normal.     Left Ear: External ear normal.     Nose: No congestion or rhinorrhea.     Mouth/Throat:     Mouth: Mucous membranes are moist.     Pharynx: Oropharynx is clear.  Eyes:     Extraocular Movements: Extraocular movements intact.     Conjunctiva/sclera: Conjunctivae normal.     Pupils: Pupils are equal, round, and reactive to light.  Cardiovascular:     Rate and Rhythm: Normal rate and regular rhythm.     Heart sounds: No murmur heard.    No friction rub. No gallop.  Pulmonary:     Effort: Pulmonary effort is normal.  Abdominal:     General: Abdomen is flat. Bowel sounds are normal.     Palpations: Abdomen is soft.  Musculoskeletal:        General: No swelling.     Cervical back: Normal range of motion and neck supple.  Skin:    General: Skin is warm and dry.  Psychiatric:        Mood and Affect: Mood normal.       Lab Results Lab Results  Component Value Date   WBC 9.8 07/01/2023   HGB 17.5 (H) 07/01/2023   HCT 50.8 07/01/2023   MCV 84.8 07/01/2023   PLT 223 07/01/2023    Lab Results  Component Value Date   CREATININE 0.78 07/02/2023   BUN 15 07/02/2023   NA 126 (L) 07/02/2023   K 3.3 (L) 07/02/2023   CL 86 (L) 07/02/2023   CO2 26 07/02/2023    Lab Results  Component Value Date   ALT 24 06/30/2023   AST 35 06/30/2023   ALKPHOS 92 06/30/2023   BILITOT 1.3 (H) 06/30/2023        Orlie Bjornstad, MD [facility] 07/02/2023, 1:33 PM

## 2023-07-02 NOTE — Progress Notes (Signed)
     Patient Name: Gerald Guzman           DOB: Feb 25, 1975  MRN: [id number]      Admission Date: 06/30/2023  Attending Provider: Oral Billings, MD  Primary Diagnosis: Cavitary pneumonia   Level of care: Progressive   OVERNIGHT PROGRESS REPORT   Concerns for aspiration and / or dysphagia  Patient was being given Tylenol , when he started choking on water. RN has concerns for patient not being able to clear secretions well which is making it difficult for him to swallow.   Some mild hypoxia, and dyspnea also reported prior. This improved with 2-3 L. Patient's mentation at baseline but drowsy.    Plan:  Keep n.p.o.  Speech eval has been ordered. Chest xray Blood gas NT suction PRN     Linell Shawn, DNP, ACNPC- AG [facility]

## 2023-07-02 NOTE — Progress Notes (Signed)
 PHARMACY - PHYSICIAN COMMUNICATION CRITICAL VALUE ALERT - BLOOD CULTURE IDENTIFICATION (BCID)  Gerald Guzman is an 48 y.o. male who presented to [facility] on 06/30/2023 with cavitary lung lesion, HIV  Assessment: BCID + Staph species (1/4) and Cryptococcal neoformans/gattii  Name of physician (or Provider) Contacted: Bailey Lesser, FNP  Current antibiotics: Azithromycin  and Augmentin  Changes to prescribed antibiotics recommended:  Begin Ambisome + flucytosine  Results for orders placed or performed during the hospital encounter of 06/30/23  Blood Culture ID Panel (Reflexed) (Collected: 07/01/2023 12:15 AM)  Result Value Ref Range   Enterococcus faecalis NOT DETECTED NOT DETECTED   Enterococcus Faecium NOT DETECTED NOT DETECTED   Listeria monocytogenes NOT DETECTED NOT DETECTED   Staphylococcus species DETECTED (A) NOT DETECTED   Staphylococcus aureus (BCID) NOT DETECTED NOT DETECTED   Staphylococcus epidermidis NOT DETECTED NOT DETECTED   Staphylococcus lugdunensis NOT DETECTED NOT DETECTED   Streptococcus species NOT DETECTED NOT DETECTED   Streptococcus agalactiae NOT DETECTED NOT DETECTED   Streptococcus pneumoniae NOT DETECTED NOT DETECTED   Streptococcus pyogenes NOT DETECTED NOT DETECTED   A.calcoaceticus-baumannii NOT DETECTED NOT DETECTED   Bacteroides fragilis NOT DETECTED NOT DETECTED   Enterobacterales NOT DETECTED NOT DETECTED   Enterobacter cloacae complex NOT DETECTED NOT DETECTED   Escherichia coli NOT DETECTED NOT DETECTED   Klebsiella aerogenes NOT DETECTED NOT DETECTED   Klebsiella oxytoca NOT DETECTED NOT DETECTED   Klebsiella pneumoniae NOT DETECTED NOT DETECTED   Proteus species NOT DETECTED NOT DETECTED   Salmonella species NOT DETECTED NOT DETECTED   Serratia marcescens NOT DETECTED NOT DETECTED   Haemophilus influenzae NOT DETECTED NOT DETECTED   Neisseria meningitidis NOT DETECTED NOT DETECTED   Pseudomonas aeruginosa NOT DETECTED NOT DETECTED    Stenotrophomonas maltophilia NOT DETECTED NOT DETECTED   Candida albicans NOT DETECTED NOT DETECTED   Candida auris NOT DETECTED NOT DETECTED   Candida glabrata NOT DETECTED NOT DETECTED   Candida krusei NOT DETECTED NOT DETECTED   Candida parapsilosis NOT DETECTED NOT DETECTED   Candida tropicalis NOT DETECTED NOT DETECTED   Cryptococcus neoformans/gattii DETECTED (A) NOT DETECTED    Gerald Guzman, Gerald Guzman, PharmD 07/02/2023  3:34 AM

## 2023-07-03 DIAGNOSIS — B20 Human immunodeficiency virus [HIV] disease: Secondary | ICD-10-CM | POA: Diagnosis not present

## 2023-07-03 DIAGNOSIS — J189 Pneumonia, unspecified organism: Secondary | ICD-10-CM | POA: Diagnosis not present

## 2023-07-03 DIAGNOSIS — J984 Other disorders of lung: Secondary | ICD-10-CM | POA: Diagnosis not present

## 2023-07-03 DIAGNOSIS — S90211A Contusion of right great toe with damage to nail, initial encounter: Secondary | ICD-10-CM | POA: Diagnosis not present

## 2023-07-03 DIAGNOSIS — S91209A Unspecified open wound of unspecified toe(s) with damage to nail, initial encounter: Secondary | ICD-10-CM | POA: Diagnosis not present

## 2023-07-03 DIAGNOSIS — R531 Weakness: Secondary | ICD-10-CM | POA: Diagnosis not present

## 2023-07-03 LAB — CSF CELL COUNT WITH DIFFERENTIAL
Eosinophils, CSF: 0 % (ref 0–1)
Eosinophils, CSF: 0 % (ref 0–1)
Lymphs, CSF: 52 % (ref 40–80)
Lymphs, CSF: 81 % — ABNORMAL HIGH (ref 40–80)
Monocyte-Macrophage-Spinal Fluid: 2 % — ABNORMAL LOW (ref 15–45)
Monocyte-Macrophage-Spinal Fluid: 3 % — ABNORMAL LOW (ref 15–45)
RBC Count, CSF: 32 /mm3 — ABNORMAL HIGH
RBC Count, CSF: 7970 /mm3 — ABNORMAL HIGH
Segmented Neutrophils-CSF: 17 % — ABNORMAL HIGH (ref 0–6)
Segmented Neutrophils-CSF: 45 % — ABNORMAL HIGH (ref 0–6)
Tube #: 1
Tube #: 4
WBC, CSF: 22 /mm3 (ref 0–5)
WBC, CSF: 30 /mm3 (ref 0–5)

## 2023-07-03 LAB — CBC
HCT: 44.8 % (ref 39.0–52.0)
Hemoglobin: 15 g/dL (ref 13.0–17.0)
MCH: 29.9 pg (ref 26.0–34.0)
MCHC: 33.5 g/dL (ref 30.0–36.0)
MCV: 89.4 fL (ref 80.0–100.0)
Platelets: 101 10*3/uL — ABNORMAL LOW (ref 150–400)
RBC: 5.01 MIL/uL (ref 4.22–5.81)
RDW: 11.3 % — ABNORMAL LOW (ref 11.5–15.5)
WBC: 10.4 10*3/uL (ref 4.0–10.5)
nRBC: 0 % (ref 0.0–0.2)

## 2023-07-03 LAB — MENINGITIS/ENCEPHALITIS PANEL (CSF)
Cryptococcus neoformans/gattii (CSF): DETECTED — AB
Cytomegalovirus (CSF): NOT DETECTED
Enterovirus (CSF): NOT DETECTED
Escherichia coli K1 (CSF): NOT DETECTED
Haemophilus influenzae (CSF): NOT DETECTED
Herpes simplex virus 1 (CSF): NOT DETECTED
Herpes simplex virus 2 (CSF): NOT DETECTED
Human herpesvirus 6 (CSF): NOT DETECTED
Human parechovirus (CSF): NOT DETECTED
Listeria monocytogenes (CSF): NOT DETECTED
Neisseria meningitis (CSF): NOT DETECTED
Streptococcus agalactiae (CSF): NOT DETECTED
Streptococcus pneumoniae (CSF): NOT DETECTED
Varicella zoster virus (CSF): NOT DETECTED

## 2023-07-03 LAB — CULTURE, RESPIRATORY W GRAM STAIN: Culture: NORMAL

## 2023-07-03 LAB — BASIC METABOLIC PANEL WITH GFR
Anion gap: 12 (ref 5–15)
BUN: 15 mg/dL (ref 6–20)
CO2: 26 mmol/L (ref 22–32)
Calcium: 8.2 mg/dL — ABNORMAL LOW (ref 8.9–10.3)
Chloride: 90 mmol/L — ABNORMAL LOW (ref 98–111)
Creatinine, Ser: 0.56 mg/dL — ABNORMAL LOW (ref 0.61–1.24)
GFR, Estimated: >60 mL/min (ref >60)
Glucose, Bld: 165 mg/dL — ABNORMAL HIGH (ref 70–99)
Potassium: 3.1 mmol/L — ABNORMAL LOW (ref 3.5–5.1)
Sodium: 128 mmol/L — ABNORMAL LOW (ref 135–145)

## 2023-07-03 LAB — MAGNESIUM: Magnesium: 2 mg/dL (ref 1.7–2.4)

## 2023-07-03 LAB — VDRL, CSF: VDRL Quant, CSF: NONREACTIVE

## 2023-07-03 MED ORDER — LORAZEPAM 2 MG/ML IJ SOLN
0.5000 mg | Freq: Once | INTRAMUSCULAR | Status: AC | PRN
  Administered 2023-07-03: 0.5 mg via INTRAVENOUS
  Filled 2023-07-03: qty 1

## 2023-07-03 MED ORDER — ACETAMINOPHEN 650 MG RE SUPP
RECTAL | Status: AC
Start: 2023-07-03 — End: 2023-07-03
  Administered 2023-07-03: 650 mg via RECTAL
  Filled 2023-07-03: qty 1

## 2023-07-03 MED ORDER — POTASSIUM CHLORIDE 10 MEQ/100ML IV SOLN
10.0000 meq | INTRAVENOUS | Status: AC
  Administered 2023-07-03 (×4): 10 meq via INTRAVENOUS
  Filled 2023-07-03 (×4): qty 100

## 2023-07-03 MED ORDER — ACETAMINOPHEN 650 MG RE SUPP
650.0000 mg | Freq: Once | RECTAL | Status: AC
  Administered 2023-07-03: 650 mg via RECTAL
  Filled 2023-07-03: qty 1

## 2023-07-03 MED ORDER — ACETAMINOPHEN 650 MG RE SUPP: 650.0000 mg | Freq: Once | RECTAL | Status: AC

## 2023-07-03 MED ORDER — NYSTATIN 100000 UNIT/ML MT SUSP
5.0000 mL | Freq: Four times a day (QID) | OROMUCOSAL | Status: DC
  Administered 2023-07-03 – 2023-07-06 (×9): 500000 [IU] via ORAL
  Filled 2023-07-03 (×11): qty 5

## 2023-07-03 NOTE — Progress Notes (Signed)
 Progress Note   Patient: Gerald Guzman HYQ:[id number] DOB: July 30, 1975 DOA: 06/30/2023     3 DOS: the patient was seen and examined on 07/03/2023   Brief hospital course: 48 y.o. male with history of HIV who has not been taking his antiretrovirals for many years has been in jail for last 2 months and states that the last 2 to 3 days he has been feeling very weak dizzy and today he stumbled and hurt his right foot great toe while walking down the stairs.  Patient states over the last 3 days he has been placed back on his antihypertensives and his antiretroviral.  He has been having poor appetite last 3 days with nausea vomiting.   In the ER patient appears generally weak cachectic nonfocal. X-ray of the right foot shows a wound and has an avulsed nail probably causing the wound. Metabolic panel shows hyponatremia 124 and hypokalemia of 2.3. Glucose of 193. WBC of 12 hemoglobin 17.7. Since patient was complaining of nausea vomiting and poor appetite CT abdomen pelvis was done which did not show anything acute in the abdomen but did show cavitary lesion in the right costophrenic angle concerning for necrotizing pneumonia/neoplasm/fungal lesions or tuberculosis. Cultures obtained and patient was placed on airborne precautions and admitted for further workup   Assessment and Plan:   Cavitary lung lesion -    Concerns for pneumonia/neoplasm/tuberculosis/fungal infection.   F/u on blood cultures, sputum for AFB, QuantiFERON gold test, Fungitell test  Cont empiric abx Pulm following, plan for bronch 6/4 HIV  Reportedlly has not taken his medicines for many years now.  CD4 low at <35 with viral load pending Cryptococcus is pos. Pt appears altered. LP performed 6/2, pos for crypto ID following Headache Head CT reviewed, normal S/p LP, findings pos for crypto. 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 Improving Recheck bmet in AM Hypertensive urgency  patient was recently restarted on hydralazine amlodipine and hydrochlorothiazide  3 days ago.   Cont amlodipine and hydralazine but hold hydrochlorothiazide  due to hyponatremia and hypokalemia. Continue beta blocker secondary to sinus tach Right great toe wound   WOC consulted Erythrocytosis  Repeat CBC in AM Sinus tach Cont to correct lytes per above Improved with scheduled BB, will continue Diffuse fluid filled esophagus with circumferential wall thickening Concerns for inflammatory vs infectious esophagitis GI consulted. Recs for EGD prior to d/c Possible Aspiration PNA Aspiration event noted recently Cont empiric abx   Subjective: Asking for food when seen  Physical Exam: Vitals:   07/03/23 0133 07/03/23 0556 07/03/23 1127 07/03/23 1553  BP: (!) 149/99 (!) 151/110 (!) 161/110 (!) 155/105  Pulse: (!) 128 (!) 131 (!) 129 (!) 129  Resp: 15 20 (!) 22 (!) 21  Temp: 99.8 F (37.7 C) 98.1 F (36.7 C) 98.7 F (37.1 C) 98.7 F (37.1 C)  TempSrc: Axillary Oral Oral Oral  SpO2: 96% 92% 92% 95%  Weight:      Height:       General exam: Conversant, in no acute distress Respiratory system: normal chest rise, clear, no audible wheezing Cardiovascular system: regular rhythm, s1-s2 Gastrointestinal system: Nondistended, nontender, pos BS Central nervous system: No seizures, no tremors Extremities: No cyanosis, no joint deformities Skin: No rashes, no pallor Psychiatry: Affect normal // no auditory hallucinations  Data Reviewed:  Labs reviewed: Na 128, K 3.1, Cr 0.56, WBC 10.4, Hgb 15.0, Plts 101   Family Communication: Pt in room, family not at bedside  Disposition: Status is: Inpatient Remains inpatient appropriate because: severity of illness  Planned Discharge Destination:  Unknown    Author: Cherylle Corwin, MD 07/03/2023 5:27 PM  For on call review [email or web address].

## 2023-07-03 NOTE — Evaluation (Signed)
 Clinical/Bedside Swallow Evaluation Patient Details  Name: NTHONY LEFFERTS MRN: [id number] Date of Birth: [birth year]  Today's Date: 07/03/2023 Time: SLP Start Time (ACUTE ONLY): 1245 SLP Stop Time (ACUTE ONLY): 1305 SLP Time Calculation (min) (ACUTE ONLY): 20 min  Past Medical History:  Past Medical History:  Diagnosis Date   Alcoholism (HCC) 02/01/2016   HIV infection (HCC)    Hypertension    Past Surgical History:  Past Surgical History:  Procedure Laterality Date   HAND SURGERY     HPI:  Patient is a 48 y.o. male coming from jail with PMH: HTN,  HIV (not taking antiretrovirals since at least 2018), has been in jail for past two months. He presented to the hospital on 06/30/23 after 2-3 days of feeling dizzy, followed by him stumbling and hurting right foot great toe while walking down stairs. In ER patient appears weak, cachectic. X-ray of right foot shows wound and avulsed nail. CT abdomen pelvis was done which did not show anything acute in the abdomen but did show cavitary lesion in the right costophrenic angle concerning for necrotizing pneumonia/neoplasm/fungal lesions or tuberculosis.  Cultures obtained and patient was placed on airborne precautions and admitted for further workup. On 6/2 patient observed to have coughing when taking Tylenol  with water as well as concerns for patient not clearing his secretions, having mild  hypoxia and dyspnea. He was made NPO and SLP swallow evaluation ordered.    Assessment / Plan / Recommendation  Clinical Impression  Patient was awake and interactive but required cues to initiate most actions. His voice was wet, congested and largely aphonic. SLP inspected oral cavity with wet secretions adhered to hard palate. Patient cued to cough which resulted in mobilization of copious secretions to oropharynx that SLP suctioned out via [place]. SLP then assessed his swallow function via single, small ice chips. Patient with reduced hyolaryngeal movement and  almost immediate cough response, leading to mobilization of more secretions. This occured with all of the three ice chips given. Patient's voice sounded marginally more clear immediately after coughing up secretions which SLP suctioned, however remained aphonic. Patient at a very high risk of aspiration and decompensation and recommend continue NPO status. PRN small amounts of ice chips may be given by SLP or RN only with full supervision with oral suction readily available to clear secretions. SLP will follow for readiness. SLP Visit Diagnosis: Dysphagia, unspecified (R13.10)    Aspiration Risk  Severe aspiration risk;Risk for inadequate nutrition/hydration    Diet Recommendation NPO;Ice chips PRN after oral care    Postural Changes: Seated upright at 90 degrees    Other  Recommendations Oral Care Recommendations: Staff/trained caregiver to provide oral care;Oral care QID;Oral care prior to ice chip/H20    Recommendations for follow up therapy are one component of a multi-disciplinary discharge planning process, led by the attending physician.  Recommendations may be updated based on patient status, additional functional criteria and insurance authorization.  Follow up Recommendations Other (comment) (TBD)      Assistance Recommended at Discharge    Functional Status Assessment Patient has had a recent decline in their functional status and demonstrates the ability to make significant improvements in function in a reasonable and predictable amount of time.  Frequency and Duration min 2x/week  2 weeks       Prognosis Prognosis for improved oropharyngeal function: Good Barriers to Reach Goals: Cognitive deficits      Swallow Study   General Date of Onset: 07/03/23 HPI: Patient is a  48 y.o. male coming from jail with PMH: HTN,  HIV (not taking antiretrovirals since at least 2018), has been in jail for past two months. He presented to the hospital on 06/30/23 after 2-3 days of feeling  dizzy, followed by him stumbling and hurting right foot great toe while walking down stairs. In ER patient appears weak, cachectic. X-ray of right foot shows wound and avulsed nail. CT abdomen pelvis was done which did not show anything acute in the abdomen but did show cavitary lesion in the right costophrenic angle concerning for necrotizing pneumonia/neoplasm/fungal lesions or tuberculosis.  Cultures obtained and patient was placed on airborne precautions and admitted for further workup. On 6/2 patient observed to have coughing when taking Tylenol  with water as well as concerns for patient not clearing his secretions, having mild  hypoxia and dyspnea. He was made NPO and SLP swallow evaluation ordered. Type of Study: Bedside Swallow Evaluation Previous Swallow Assessment: none found Diet Prior to this Study: NPO Temperature Spikes Noted: No Respiratory Status: Nasal cannula History of Recent Intubation: No Behavior/Cognition: Alert;Cooperative;Requires cueing Oral Cavity Assessment: Dried secretions;Excessive secretions Oral Care Completed by SLP: Yes Oral Cavity - Dentition: Adequate natural dentition Vision: Functional for self-feeding Self-Feeding Abilities: Able to feed self Patient Positioning: Upright in bed Baseline Vocal Quality: Wet;Aphonic Volitional Cough: Congested;Weak Volitional Swallow: Able to elicit    Oral/Motor/Sensory Function Overall Oral Motor/Sensory Function: Within functional limits   Ice Chips Ice chips: Impaired Pharyngeal Phase Impairments: Decreased hyoid-laryngeal movement;Cough - Immediate   Thin Liquid Thin Liquid: Not tested    Nectar Thick Nectar Thick Liquid: Not tested   Honey Thick Honey Thick Liquid: Not tested   Puree Puree: Not tested   Solid     Solid: Not tested      Jacqualine Mater, MA, CCC-SLP Speech Therapy

## 2023-07-03 NOTE — Progress Notes (Signed)
   NAME:  Gerald Guzman, MRN:  [id number], DOB:  11-16-1975, LOS: 3 ADMISSION DATE:  06/30/2023, CONSULTATION DATE: 07/01/2023 REFERRING MD: Dr. Joan Mouton, CHIEF COMPLAINT: Cavitary lung disease  History of Present Illness:  Patient was brought in [facility] the prison [facility]   Her histo   Was complaining of some abdominal pain, did have abdominal CT showing a right lower lobe cavitary process Had a CT scan of the chest showing cavitary process in the right lower lobe cavitary process, right middle lobe cavitary mass, subcarinal adenopathy   He is very sleepy and not very interactive at present Was not having any respiratory complaints History of HIV disease, has not been compliant with treatment  Pertinent  Medical History   Past Medical History:  Diagnosis Date   Alcoholism (HCC) 02/01/2016   HIV infection (HCC)    Hypertension    Significant Hospital Events: Including procedures, antibiotic start and stop dates in addition to other pertinent events   CT 07/01/2023-right middle lobe cavitary process, right lower lobe cavitary process with subcarinal adenopathy 6/2-lumbar puncture, noted high opening pressures, crypto positive   Interim History / Subjective:   Less lethargy, still with significant effort to arouse Slightly more interactive today Objective    Blood pressure (!) 151/110, pulse (!) 131, temperature 98.1 F (36.7 C), temperature source Oral, resp. rate 20, height 6\' 3"  (1.905 m), weight 68 kg, SpO2 92%.        Intake/Output Summary (Last 24 hours) at 07/03/2023 1009 Last data filed at 07/03/2023 0900 Gross per 24 hour  Intake 2010.62 ml  Output --  Net 2010.62 ml   Filed Weights   06/30/23 1752  Weight: 68 kg    Examination: General: Middle-aged, chronically ill-appearing HENT: Dry oral mucosa Lungs: Bilateral rhonchi Cardiovascular: S1-S2 appreciated Abdomen: Soft, bowel sounds appreciated  I reviewed labs - Crypto positive - Crypto positive in CSF  Resolved  problem list   Assessment and Plan   HIV disease, noncompliant with treatment Cavitary lung disease -Right middle lobe cavitary process -Right lower lobe cavitary process -May be in the context of cryptococcus pneumonia however with his immunocompromise status, needs to rule out other opportunistic infections -Tentatively scheduled for bronchoscopy 07/04/2023  Possibility of aspiration pneumonia - Rule out tuberculosis, rule out other fungal etiologies-cryptococcus will definitely be a possibility  Follow QuantiFERON gold  Sputum for AFB  Cryptococcal meningitis - AmBisome and flucytosine  Needs further workup for his thickened esophagus   Will follow

## 2023-07-03 NOTE — Progress Notes (Addendum)
 Pharmacy Antibiotic Note  Gerald Guzman is a 48 y.o. male admitted on 06/30/2023 with cavitary lung lesion/disseminated cryptococcal disease. PMH significant for HIV.  Pharmacy has been consulted for Ambisome and Flucytosine dosing. Scr stable today at 0.56. K low at 3.1 after 5 runs of potassium yesterday. Mg 2 after 2 gm replacement yesterday.   Plan: Ambisome 200mg  (3mg /kg) IV q24h Flucytosine 2000 mg po q6h> Rounded up to get closer to 25 mg/kg per dose  (Notably patient NPO so has not received any of this medication)  Potassium 60 mEq IV today  No magnesium replacement today Pharmacy managing electrolytes while on amphotericin B   Height: 6\' 3"  (190.5 cm) Weight: 68 kg (150 lb) IBW/kg (Calculated) : 84.5  Temp (24hrs), Avg:99.1 F (37.3 C), Min:98.1 F (36.7 C), Max:99.8 F (37.7 C)  Recent Labs  Lab 06/30/23 1829 07/01/23 0015 07/01/23 0840 07/01/23 1602 07/02/23 0353 07/03/23 0542 07/03/23 0549  WBC 12.0* 9.8  --   --   --   --  10.4  CREATININE 0.91 0.78 0.65 0.52* 0.78 0.56*  --     Estimated Creatinine Clearance: 108.6 mL/min (A) (by C-G formula based on SCr of 0.56 mg/dL (L)).    No Known Allergies  Antimicrobials this admission: 6/1 Ceftriaxone  x 1 6/1 Augmentin >>  6/2 6/1 Azithromycin  >>6/2 6/2 Unasyn> 6/2 Ambisome >> 6/2 Flucytosine >>   Thank you for allowing pharmacy to be a part of this patient's care.  Denson Flake, PharmD, BCPS, BCIDP Infectious Diseases Clinical Pharmacist Phone: [phone] 07/03/2023 8:30 AM

## 2023-07-04 ENCOUNTER — Inpatient Hospital Stay (HOSPITAL_COMMUNITY): Payer: Self-pay | Admitting: Certified Registered Nurse Anesthetist

## 2023-07-04 ENCOUNTER — Encounter (HOSPITAL_COMMUNITY): Admission: EM | Disposition: E | Payer: Self-pay | Attending: Internal Medicine

## 2023-07-04 ENCOUNTER — Inpatient Hospital Stay (HOSPITAL_COMMUNITY)

## 2023-07-04 DIAGNOSIS — J188 Other pneumonia, unspecified organism: Secondary | ICD-10-CM | POA: Diagnosis not present

## 2023-07-04 DIAGNOSIS — I10 Essential (primary) hypertension: Secondary | ICD-10-CM

## 2023-07-04 DIAGNOSIS — R590 Localized enlarged lymph nodes: Secondary | ICD-10-CM | POA: Diagnosis not present

## 2023-07-04 DIAGNOSIS — R918 Other nonspecific abnormal finding of lung field: Secondary | ICD-10-CM | POA: Diagnosis not present

## 2023-07-04 DIAGNOSIS — B451 Cerebral cryptococcosis: Secondary | ICD-10-CM | POA: Diagnosis not present

## 2023-07-04 DIAGNOSIS — F17210 Nicotine dependence, cigarettes, uncomplicated: Secondary | ICD-10-CM

## 2023-07-04 DIAGNOSIS — R7881 Bacteremia: Secondary | ICD-10-CM | POA: Diagnosis not present

## 2023-07-04 DIAGNOSIS — J984 Other disorders of lung: Secondary | ICD-10-CM | POA: Diagnosis not present

## 2023-07-04 DIAGNOSIS — J189 Pneumonia, unspecified organism: Secondary | ICD-10-CM | POA: Diagnosis not present

## 2023-07-04 HISTORY — PX: BRONCHIAL BRUSHINGS: SHX5108

## 2023-07-04 HISTORY — PX: ENDOBRONCHIAL ULTRASOUND: SHX5096

## 2023-07-04 HISTORY — PX: BRONCHIAL NEEDLE ASPIRATION BIOPSY: SHX5106

## 2023-07-04 HISTORY — PX: BRONCHIAL WASHINGS: SHX5105

## 2023-07-04 LAB — BODY FLUID CELL COUNT WITH DIFFERENTIAL
Eos, Fluid: 0 %
Lymphs, Fluid: 14 %
Monocyte-Macrophage-Serous Fluid: 2 % — ABNORMAL LOW (ref 50–90)
Neutrophil Count, Fluid: 84 % — ABNORMAL HIGH (ref 0–25)
Total Nucleated Cell Count, Fluid: UNDETERMINED uL (ref 0–1000)

## 2023-07-04 LAB — CBC
HCT: 45.3 % (ref 39.0–52.0)
Hemoglobin: 15.2 g/dL (ref 13.0–17.0)
MCH: 29.3 pg (ref 26.0–34.0)
MCHC: 33.6 g/dL (ref 30.0–36.0)
MCV: 87.5 fL (ref 80.0–100.0)
Platelets: 124 10*3/uL — ABNORMAL LOW (ref 150–400)
RBC: 5.18 MIL/uL (ref 4.22–5.81)
RDW: 11.2 % — ABNORMAL LOW (ref 11.5–15.5)
WBC: 7.3 10*3/uL (ref 4.0–10.5)
nRBC: 0 % (ref 0.0–0.2)

## 2023-07-04 LAB — LEGIONELLA PNEUMOPHILA SEROGP 1 UR AG: L. pneumophila Serogp 1 Ur Ag: NEGATIVE

## 2023-07-04 LAB — QUANTIFERON-TB GOLD PLUS (RQFGPL)
QuantiFERON Mitogen Value: 0.61 [IU]/mL
QuantiFERON Nil Value: 0.04 [IU]/mL
QuantiFERON TB1 Ag Value: 0.04 [IU]/mL
QuantiFERON TB2 Ag Value: 0.04 [IU]/mL

## 2023-07-04 LAB — CULTURE, BLOOD (ROUTINE X 2)
Culture  Setup Time: NO GROWTH
Special Requests: ADEQUATE

## 2023-07-04 LAB — QUANTIFERON-TB GOLD PLUS: QuantiFERON-TB Gold Plus: NEGATIVE

## 2023-07-04 LAB — COMPREHENSIVE METABOLIC PANEL WITH GFR
ALT: 10 U/L (ref 0–44)
AST: 57 U/L — ABNORMAL HIGH (ref 15–41)
Albumin: 2.6 g/dL — ABNORMAL LOW (ref 3.5–5.0)
Alkaline Phosphatase: 56 U/L (ref 38–126)
Anion gap: 13 (ref 5–15)
BUN: 11 mg/dL (ref 6–20)
CO2: 26 mmol/L (ref 22–32)
Calcium: 8.3 mg/dL — ABNORMAL LOW (ref 8.9–10.3)
Chloride: 90 mmol/L — ABNORMAL LOW (ref 98–111)
Creatinine, Ser: 0.68 mg/dL (ref 0.61–1.24)
GFR, Estimated: >60 mL/min (ref >60)
Glucose, Bld: 85 mg/dL (ref 70–99)
Potassium: 3.2 mmol/L — ABNORMAL LOW (ref 3.5–5.1)
Sodium: 129 mmol/L — ABNORMAL LOW (ref 135–145)
Total Bilirubin: 1.3 mg/dL — ABNORMAL HIGH (ref 0.0–1.2)
Total Protein: 6.1 g/dL — ABNORMAL LOW (ref 6.5–8.1)

## 2023-07-04 LAB — FUNGITELL BETA-D-GLUCAN: Result Name:: POSITIVE — AB

## 2023-07-04 LAB — MAGNESIUM: Magnesium: 2.1 mg/dL (ref 1.7–2.4)

## 2023-07-04 LAB — CYTOLOGY - NON PAP

## 2023-07-04 SURGERY — ENDOBRONCHIAL ULTRASOUND (EBUS)
Anesthesia: General | Laterality: Right

## 2023-07-04 MED ORDER — FENTANYL CITRATE (PF) 100 MCG/2ML IJ SOLN
INTRAMUSCULAR | Status: DC | PRN
  Administered 2023-07-04 (×2): 50 ug via INTRAVENOUS

## 2023-07-04 MED ORDER — PHENYLEPHRINE HCL-NACL 20-0.9 MG/250ML-% IV SOLN
INTRAVENOUS | Status: DC | PRN
Start: 2023-07-04 — End: 2023-07-04
  Administered 2023-07-04: 50 ug/min via INTRAVENOUS

## 2023-07-04 MED ORDER — PHENYLEPHRINE HCL (PRESSORS) 10 MG/ML IV SOLN
INTRAVENOUS | Status: AC
  Filled 2023-07-04: qty 1

## 2023-07-04 MED ORDER — HYDRALAZINE HCL 20 MG/ML IJ SOLN
10.0000 mg | INTRAMUSCULAR | Status: DC | PRN
  Administered 2023-07-04 – 2023-07-07 (×12): 10 mg via INTRAVENOUS
  Filled 2023-07-04 (×13): qty 1

## 2023-07-04 MED ORDER — ONDANSETRON HCL 4 MG/2ML IJ SOLN
INTRAMUSCULAR | Status: DC | PRN
  Administered 2023-07-04: 4 mg via INTRAVENOUS

## 2023-07-04 MED ORDER — LABETALOL HCL 5 MG/ML IV SOLN: 10.0000 mg | INTRAVENOUS | Status: DC | PRN

## 2023-07-04 MED ORDER — FENTANYL CITRATE (PF) 100 MCG/2ML IJ SOLN
INTRAMUSCULAR | Status: AC
  Filled 2023-07-04: qty 2

## 2023-07-04 MED ORDER — PROPOFOL 10 MG/ML IV BOLUS
INTRAVENOUS | Status: AC
  Filled 2023-07-04: qty 20

## 2023-07-04 MED ORDER — DEXAMETHASONE SODIUM PHOSPHATE 10 MG/ML IJ SOLN
INTRAMUSCULAR | Status: DC | PRN
Start: 2023-07-04 — End: 2023-07-04
  Administered 2023-07-04: 5 mg via INTRAVENOUS

## 2023-07-04 MED ORDER — SUCCINYLCHOLINE CHLORIDE 200 MG/10ML IV SOSY
PREFILLED_SYRINGE | INTRAVENOUS | Status: DC | PRN
Start: 2023-07-04 — End: 2023-07-04
  Administered 2023-07-04: 140 mg via INTRAVENOUS

## 2023-07-04 MED ORDER — LABETALOL HCL 5 MG/ML IV SOLN
5.0000 mg | INTRAVENOUS | Status: DC | PRN
  Administered 2023-07-04 – 2023-07-07 (×10): 5 mg via INTRAVENOUS
  Filled 2023-07-04 (×10): qty 4

## 2023-07-04 MED ORDER — POTASSIUM CHLORIDE 10 MEQ/100ML IV SOLN
10.0000 meq | INTRAVENOUS | Status: AC
  Administered 2023-07-04 (×2): 10 meq via INTRAVENOUS
  Filled 2023-07-04 (×4): qty 100

## 2023-07-04 MED ORDER — PROPOFOL 10 MG/ML IV BOLUS
INTRAVENOUS | Status: DC | PRN
  Administered 2023-07-04: 130 mg via INTRAVENOUS

## 2023-07-04 MED ORDER — PHENYLEPHRINE HCL (PRESSORS) 10 MG/ML IV SOLN
INTRAVENOUS | Status: DC | PRN
  Administered 2023-07-04: 240 ug via INTRAVENOUS
  Administered 2023-07-04: 160 ug via INTRAVENOUS
  Administered 2023-07-04: 80 ug via INTRAVENOUS
  Administered 2023-07-04 (×7): 160 ug via INTRAVENOUS
  Administered 2023-07-04: 80 ug via INTRAVENOUS
  Administered 2023-07-04 (×2): 160 ug via INTRAVENOUS
  Administered 2023-07-04: 200 ug via INTRAVENOUS
  Administered 2023-07-04 (×3): 160 ug via INTRAVENOUS

## 2023-07-04 MED ORDER — ROCURONIUM BROMIDE 10 MG/ML (PF) SYRINGE
PREFILLED_SYRINGE | INTRAVENOUS | Status: DC | PRN
Start: 2023-07-04 — End: 2023-07-04
  Administered 2023-07-04: 5 mg via INTRAVENOUS

## 2023-07-04 MED ORDER — LIDOCAINE HCL (PF) 2 % IJ SOLN
INTRAMUSCULAR | Status: DC | PRN
Start: 2023-07-04 — End: 2023-07-04
  Administered 2023-07-04: 60 mg via INTRADERMAL

## 2023-07-04 MED ORDER — LACTATED RINGERS IV SOLN: INTRAVENOUS | Status: DC | PRN

## 2023-07-04 MED ORDER — METOPROLOL TARTRATE 5 MG/5ML IV SOLN
10.0000 mg | Freq: Four times a day (QID) | INTRAVENOUS | Status: DC
  Administered 2023-07-04 – 2023-07-06 (×8): 10 mg via INTRAVENOUS
  Filled 2023-07-04 (×8): qty 10

## 2023-07-04 MED ORDER — SODIUM CHLORIDE 0.9 % IV SOLN
INTRAVENOUS | Status: DC | PRN
Start: 2023-07-04 — End: 2023-07-04

## 2023-07-04 MED ORDER — POTASSIUM CHLORIDE 10 MEQ/100ML IV SOLN
10.0000 meq | INTRAVENOUS | Status: AC
  Administered 2023-07-04 (×5): 10 meq via INTRAVENOUS
  Filled 2023-07-04 (×5): qty 100

## 2023-07-04 NOTE — Progress Notes (Signed)
 [facility]  Date of Admission:  06/30/2023   Total days of inpatient antibiotics 4  Principal Problem:   Cavitary pneumonia Active Problems:   HIV disease (HCC)   Benign essential HTN   Alcoholism (HCC)   Near syncope   Hyponatremia   Hypokalemia   Nausea & vomiting   Hyperglycemia          Assessment: 48 yo male hiv last seen in rcid 2018 of antiviral for many years, brought in from jail (where he had stayed for the last 2 months) with a few days poor appetite, n/v, weakness/dizziness and a ground level fall, with ED workup showing cavitary pna/lung mass:   #Cryptococcal neoformans bacteremia with meningitis, high opening pressure #Pulmonary mass with cavitary changes - Blood cultures from admission grew cryptococcus.  Staph capitis as well which is likely contamination. - CT chest showed multilobar cavitary mass in right middle lobe measuring up to 5.6 cm with distal satellite cavitary nodules.  Diffuse fluid-filled esophagus with separation wall thickening. - Serum cryptococcal antigen titer>2560 -She has been on antibiotics for the pulmonary mass, TB workup AFB sputum x 3 pending -TTE no veg #Esophageal thickening - GI consulted who recommended workup for cavitary lung lesion and encephalopathy prior to doing endoscopy.  GI to follow periodically.  May consider EGD prior to discharge. -LP on 6/2 with "62+ cm H2O - exceeded the maximum height of manometer " , ME panel+ crypto.  #HIV/AIDS -<35, HIV RNA pending - Currently holding ART  Recommendations: -Continue Amphotericin and flucytosine(has not recived due to PO status) - Repeat blood cultures to ensure clearance - Follow LP studies. Given elevated opening pressure will need repeat LP till csp pressure normalized - he had speech eval done  today->will discuss with primary as far po plan givne not getting flucytosine -Continue Unasyn, follow respiratory cultures - Follow TAfb  sputum x3 with rifampin pcr  - Holding off ART therapy - Agree with pulmonology in regards to obtaining bronchoscopy.  Tentatively scheduled on 6/4 as last case because of TB rule out.  Recommend AFB, fungal, bacterial cultures [facility] pathology. -Airborne cautions given TB work up  Evaluation of this patient requires complex antimicrobial therapy evaluation and counseling + isolation needs for disease transmission risk assessment and mitigation   Microbiology:   Antibiotics: Augmentin 6/1 Unasyn 6/2 Hypothyroid Versed 6/1-present Cultures: Blood Stick/1 BC ID Cryptococcus neoformans and Staphylococcus species Blood cultures growing 1/4 Staph capitis, 1/4 GPR lines Urine  Other   SUBJECTIVE: More coherent today Interval: Afebrile overnight. Wbc 9.8k  Review of Systems: Review of Systems  All other systems reviewed and are negative.    Scheduled Meds:  dextrose  10 mL Intravenous Q24H   dextrose  10 mL Intravenous Q24H   flucytosine  2,000 mg Oral Q6H   metoprolol tartrate  10 mg Intravenous Q6H   mupirocin ointment   Topical BID   nystatin  5 mL Oral QID   pantoprazole  (PROTONIX ) IV  40 mg Intravenous Q24H   sodium chloride  500 mL Intravenous Q24H   sodium chloride  500 mL Intravenous Q24H   Continuous Infusions:  amphotericin B liposome (AMBISOME) 200 mg in dextrose 5 % 500 mL IVPB 200 mg (07/04/23 0428)   ampicillin-sulbactam (UNASYN) IV 3 g (07/04/23 0635)   PRN Meds:.acetaminophen , acetaminophen , diphenhydrAMINE **OR** diphenhydrAMINE, hydrALAZINE, labetalol, levalbuterol, meperidine (DEMEROL) injection No Known Allergies  OBJECTIVE: Vitals:   07/04/23 0630 07/04/23 1610 07/04/23 9604  07/04/23 0644  BP: (!) 182/120     Pulse:      Resp: 18 17 19 19   Temp: 98.7 F (37.1 C)     TempSrc: Oral     SpO2: 100%     Weight:      Height:       Body mass index is 18.75 kg/m.  Physical Exam Constitutional:      General: He is not in acute distress.     Appearance: He is normal weight.  HENT:     Head: Normocephalic and atraumatic.     Right Ear: External ear normal.     Left Ear: External ear normal.     Nose: No congestion or rhinorrhea.     Mouth/Throat:     Mouth: Mucous membranes are moist.     Pharynx: Oropharynx is clear.  Eyes:     Extraocular Movements: Extraocular movements intact.     Conjunctiva/sclera: Conjunctivae normal.     Pupils: Pupils are equal, round, and reactive to light.  Cardiovascular:     Rate and Rhythm: Normal rate and regular rhythm.     Heart sounds: No murmur heard.    No friction rub. No gallop.  Pulmonary:     Effort: Pulmonary effort is normal.  Abdominal:     General: Abdomen is flat. Bowel sounds are normal.     Palpations: Abdomen is soft.  Musculoskeletal:        General: No swelling.     Cervical back: Normal range of motion and neck supple.  Skin:    General: Skin is warm and dry.  Psychiatric:        Mood and Affect: Mood normal.       Lab Results Lab Results  Component Value Date   WBC 7.3 07/04/2023   HGB 15.2 07/04/2023   HCT 45.3 07/04/2023   MCV 87.5 07/04/2023   PLT 124 (L) 07/04/2023    Lab Results  Component Value Date   CREATININE 0.68 07/04/2023   BUN 11 07/04/2023   NA 129 (L) 07/04/2023   K 3.2 (L) 07/04/2023   CL 90 (L) 07/04/2023   CO2 26 07/04/2023    Lab Results  Component Value Date   ALT 10 07/04/2023   AST 57 (H) 07/04/2023   ALKPHOS 56 07/04/2023   BILITOT 1.3 (H) 07/04/2023        Orlie Bjornstad, MD [facility] 07/04/2023, 7:00 AM

## 2023-07-04 NOTE — Plan of Care (Signed)

## 2023-07-04 NOTE — Plan of Care (Signed)

## 2023-07-04 NOTE — Op Note (Signed)
 Video Bronchoscopy with Endobronchial Ultrasound Procedure Note  Date of Operation: 07/04/2023  Pre-op Diagnosis: Right middle lobe cavitary lesion, mediastinal adenopathy  Post-op Diagnosis: Same  Surgeon: Racheal Buddle  Assistants: None  Anesthesia: General endotracheal anesthesia  Operation: Flexible video fiberoptic bronchoscopy with endobronchial ultrasound and biopsies.  Estimated Blood Loss: Minimal  Complications: None apparent  Indications and History: Gerald Guzman is a 48 y.o. male with history of HIV/AIDS, admitted with cryptococcal meningitis.  Found to have a cavitary right middle lobe lesion with mediastinal adenopathy.  Recommendation made to achieve a tissue diagnosis via bronchoscopy, endobronchial ultrasound with biopsies.  The risks, benefits, complications, treatment options and expected outcomes were discussed with the patient.  The possibilities of pneumothorax, pneumonia, reaction to medication, pulmonary aspiration, perforation of a viscus, bleeding, failure to diagnose a condition and creating a complication requiring transfusion or operation were discussed with the patient who freely signed the consent.    Description of Procedure: The patient was examined in the preoperative area and history and data from the preprocedure consultation were reviewed. It was deemed appropriate to proceed.  The patient was taken to [facility] endoscopy room 1, identified as [name] and the procedure verified as Flexible Video Fiberoptic Bronchoscopy.  A Time Out was held and the above information confirmed. After being taken to the operating room general anesthesia was initiated and the patient  was orally intubated. The video fiberoptic bronchoscope was introduced via the endotracheal tube and a general inspection was performed which showed clear white secretions in the right sided airways extending up into the right mainstem bronchus.  A tracheal aspirate was obtained by  suctioning the secretions to be sent for microbiology.  No endobronchial lesions were noted.  There was no hemoptysis.  Under fluoroscopic guidance transbronchial brushings were performed in the right middle lobe to be sent for cytology.  Finally a bronchoalveolar lavage was performed in the right middle lobe to be sent for culture, cell count, cytology.  80 cc normal saline was instilled and approximately 45 cc returned. The standard scope was then withdrawn and the endobronchial ultrasound was used to identify and characterize the peritracheal, hilar and bronchial lymph nodes. Inspection showed enlargement at station 7 and station 10R. Using real-time ultrasound guidance Wang needle biopsies were take from Station 7 and 10R nodes and were sent for cytology. The patient tolerated the procedure well without apparent complications. There was no significant blood loss. The bronchoscope was withdrawn. Anesthesia was reversed and the patient was taken to the PACU for recovery.   Samples: 1. Wang needle biopsies from 7 node 2. Wang needle biopsies from 10R node 3.  Tracheal aspirate from the right mainstem bronchus 4.  Bronchoalveolar lavage from the right middle lobe 5.  Transbronchial brushings from the right middle lobe  Plans:  The patient will be transferred from endoscopy unit back to his regular hospital bed when recovered from anesthesia. We will review the cytology, pathology and microbiology results with the patient when they become available.    [name] 07/04/2023

## 2023-07-04 NOTE — Anesthesia Procedure Notes (Signed)
 Procedure Name: Intubation Date/Time: 07/04/2023 2:07 PM  Performed by: Manuela Sella, CRNAPre-anesthesia Checklist: Patient identified, Emergency Drugs available, Suction available and Patient being monitored Patient Re-evaluated:Patient Re-evaluated prior to induction Oxygen Delivery Method: Circle system utilized Preoxygenation: Pre-oxygenation with 100% oxygen Induction Type: IV induction and Rapid sequence Laryngoscope Size: Mac and 4 Grade View: Grade II Tube type: Oral Tube size: 8.5 mm Number of attempts: 1 Airway Equipment and Method: Stylet Placement Confirmation: ETT inserted through vocal cords under direct vision, positive ETCO2 and breath sounds checked- equal and bilateral Secured at: 24 cm Tube secured with: Tape Dental Injury: Teeth and Oropharynx as per pre-operative assessment  Difficulty Due To: Difficulty was unanticipated Comments: GRADE 2 view with head being lifted and anterior pressure on larynx

## 2023-07-04 NOTE — Progress Notes (Addendum)
   NAME:  Gerald Guzman, MRN:  [id number], DOB:  1975/07/23, LOS: 4 ADMISSION DATE:  06/30/2023, CONSULTATION DATE: 07/01/2023 REFERRING MD: Dr. Joan Mouton, CHIEF COMPLAINT: Cavitary lung disease  History of Present Illness:  Patient was brought in from the prison system   Her histo   Was complaining of some abdominal pain, did have abdominal CT showing a right lower lobe cavitary process Had a CT scan of the chest showing cavitary process in the right lower lobe cavitary process, right middle lobe cavitary mass, subcarinal adenopathy   He is very sleepy and not very interactive at present Was not having any respiratory complaints History of HIV disease, has not been compliant with treatment  Pertinent  Medical History   Past Medical History:  Diagnosis Date   Alcoholism (HCC) 02/01/2016   HIV infection (HCC)    Hypertension    Significant Hospital Events: Including procedures, antibiotic start and stop dates in addition to other pertinent events   CT 07/01/2023-right middle lobe cavitary process, right lower lobe cavitary process with subcarinal adenopathy 6/2-lumbar puncture, noted high opening pressures, crypto positive   Interim History / Subjective:  Ill-appearing, somewhat lethargic.  Redirectable will answer questions Describes cough, some upper airway secretions.  Denies chest pain Currently on 2.5 L/min   Objective    Blood pressure (!) 150/100, pulse (!) 118, temperature 98.6 F (37 C), resp. rate 20, height 6\' 3"  (1.905 m), weight 68 kg, SpO2 100%.        Intake/Output Summary (Last 24 hours) at 07/04/2023 0944 Last data filed at 07/04/2023 4098 Gross per 24 hour  Intake 1405 ml  Output 2050 ml  Net -645 ml   Filed Weights   06/30/23 1752  Weight: 68 kg    Examination: General: Chronically ill-appearing thin man laying in bed in no distress HENT: Upper airway noises, secretions, intermittently coughing.  Oropharynx otherwise clear, dry Lungs: Scattered lateral  inspiratory rhonchi, no wheezes Cardiovascular: Tachycardic, regular, no murmur Abdomen: Nondistended with positive bowel sounds  Labs: QuantiFERON gold pending from 6/1 Sputum AFB pending  Resolved problem list   Assessment and Plan   HIV disease, noncompliant with treatment Right middle lobe and right lower lobe cavitary lung disease Mediastinal adenopathy Possible lung abscess, aspiration pneumonia -Right middle lobe cavitary process -Right lower lobe cavitary process -May be in the context of cryptococcus pneumonia however with his immunocompromise status, needs to rule out other opportunistic infections -Planning for bronchoscopy with endobronchial ultrasound 6/4 - Agree with empiric antibiotics as ordered: Unasyn, Amphotericin  HIV/AIDS, cryptococcal meningitis -Amphotericin - Flucytosine - ID following  Esophageal thickening - Will need further evaluation going forward once stable from these issues.   Racheal Buddle, MD, PhD 07/04/2023, 9:49 AM [facility] [phone] or if no answer before 7:00PM call [phone] For any issues after 7:00PM please call eLink [phone]

## 2023-07-04 NOTE — Progress Notes (Addendum)
 [facility]  Date of Admission:  06/30/2023   Total days of inpatient antibiotics 5  Principal Problem:   Cavitary pneumonia Active Problems:   HIV disease (HCC)   Benign essential HTN   Alcoholism (HCC)   Near syncope   Hyponatremia   Hypokalemia   Nausea & vomiting   Hyperglycemia          Assessment: 48 yo male hiv last seen in rcid 2018 of antiviral for many years, brought in from jail (where he had stayed for the last 2 months) with a few days poor appetite, n/v, weakness/dizziness and a ground level fall, with ED workup showing cavitary pna/lung mass:   #Cryptococcal neoformans bacteremia with meningitis, Disseminated #Pulmonary mass with cavitary changes - Blood cultures from admission grew cryptococcus.  Staph capitis as well which is likely contamination. - CT chest showed multilobar cavitary mass in right middle lobe measuring up to 5.6 cm with distal satellite cavitary nodules.  Diffuse fluid-filled esophagus with separation wall thickening. - Serum cryptococcal antigen titer>2560 -She has been on antibiotics for the pulmonary mass, TB workup AFB sputum x 3 pending -TTE no veg #Esophageal thickening - GI consulted who recommended workup for cavitary lung lesion and encephalopathy prior to doing endoscopy.  GI to follow periodically.  May consider EGD prior to discharge. -LP on 6/2 with "62+ cm H2O - exceeded the maximum height of manometer " , ME panel+ crypto., yeast of smear  #HIV/AIDS -<35, HIV RNA pending - Currently holding ART  Recommendations: -Continue Amphotericin and flucytosine(has not recived due to PO status, cortek ordered) - Follow repeat blood cultures to ensure clearance - Follow LP studies.  -Please re-engage IR. Given elevated opening pressure will need repeat LP(ordered, hopefully get them today) -Continue Unasyn, follow respiratory cultures - Follow TAfb sputum x3 with rifampin pcr  - Holding off ART  therapy - Tentatively scheduled on 6/4 as last case because of TB rule out.  Recommend AFB, fungal, bacterial cultures [facility] pathology. -Airborne cautions given TB work up  Evaluation of this patient requires complex antimicrobial therapy evaluation and counseling + isolation needs for disease transmission risk assessment and mitigation   Microbiology:   Antibiotics: Augmentin 6/1 Unasyn 6/2 Hypothyroid Versed 6/1-present Cultures: Blood Stick/1 BC ID Cryptococcus neoformans and Staphylococcus species Blood cultures growing 1/4 Staph capitis, 1/4 GPR lines Urine  Other   SUBJECTIVE: No changes today Interval: Afebrile overnight. Wbc 97.3k  Review of Systems: Review of Systems  All other systems reviewed and are negative.    Scheduled Meds:  dextrose  10 mL Intravenous Q24H   dextrose  10 mL Intravenous Q24H   flucytosine  2,000 mg Oral Q6H   metoprolol tartrate  10 mg Intravenous Q6H   mupirocin ointment   Topical BID   nystatin  5 mL Oral QID   pantoprazole  (PROTONIX ) IV  40 mg Intravenous Q24H   sodium chloride  500 mL Intravenous Q24H   sodium chloride  500 mL Intravenous Q24H   Continuous Infusions:  amphotericin B liposome (AMBISOME) 200 mg in dextrose 5 % 500 mL IVPB Stopped (07/04/23 0628)   ampicillin-sulbactam (UNASYN) IV 3 g (07/04/23 0635)   potassium chloride 10 mEq (07/04/23 1015)   PRN Meds:.acetaminophen , acetaminophen , diphenhydrAMINE **OR** diphenhydrAMINE, hydrALAZINE, labetalol, levalbuterol, meperidine (DEMEROL) injection No Known Allergies  OBJECTIVE: Vitals:   07/04/23 0800 07/04/23 0849 07/04/23 0901 07/04/23 1000  BP: (!) 178/122 (!) 150/100 (!) 140/96 (!) 132/94  Pulse: (!) 129 (!) 118    Resp: (!) 30 20 (!) 25 (!) 22  Temp:  98.6 F (37 C)    TempSrc:      SpO2: 100%     Weight:      Height:       Body mass index is 18.75 kg/m.  Physical Exam Constitutional:      General: He is not in acute distress.    Appearance: He is  normal weight.  HENT:     Head: Normocephalic and atraumatic.     Right Ear: External ear normal.     Left Ear: External ear normal.     Nose: No congestion or rhinorrhea.     Mouth/Throat:     Mouth: Mucous membranes are moist.     Pharynx: Oropharynx is clear.  Eyes:     Extraocular Movements: Extraocular movements intact.     Conjunctiva/sclera: Conjunctivae normal.     Pupils: Pupils are equal, round, and reactive to light.  Cardiovascular:     Rate and Rhythm: Normal rate and regular rhythm.     Heart sounds: No murmur heard.    No friction rub. No gallop.  Pulmonary:     Effort: Pulmonary effort is normal.  Abdominal:     General: Abdomen is flat. Bowel sounds are normal.     Palpations: Abdomen is soft.  Musculoskeletal:        General: No swelling.     Cervical back: Normal range of motion and neck supple.  Skin:    General: Skin is warm and dry.  Psychiatric:        Mood and Affect: Mood normal.       Lab Results Lab Results  Component Value Date   WBC 7.3 07/04/2023   HGB 15.2 07/04/2023   HCT 45.3 07/04/2023   MCV 87.5 07/04/2023   PLT 124 (L) 07/04/2023    Lab Results  Component Value Date   CREATININE 0.68 07/04/2023   BUN 11 07/04/2023   NA 129 (L) 07/04/2023   K 3.2 (L) 07/04/2023   CL 90 (L) 07/04/2023   CO2 26 07/04/2023    Lab Results  Component Value Date   ALT 10 07/04/2023   AST 57 (H) 07/04/2023   ALKPHOS 56 07/04/2023   BILITOT 1.3 (H) 07/04/2023        Orlie Bjornstad, MD [facility] 07/04/2023, 10:57 AM

## 2023-07-04 NOTE — Progress Notes (Signed)
 PROGRESS NOTE Gerald Guzman  UEA:[id number] DOB: Jun 20, 1975 DOA: 06/30/2023 PCP: Pcp, No  Brief Narrative/Hospital Course: 41 yom w/ HIV who has not been taking his antiretrovirals for many years has been in jail for last 2 months, feeling very weak dizzy 2-3 days PTA and he stumbled and hurt his right foot great toe while walking down the stairs presented to ED. Of note for last 3 days PTA he has been placed back on his antihypertensives and antiretroviral but having poor appetite last 3 days with nausea vomiting.  In the ER,  patient appears generally weak cachectic nonfocal. X-ray of the right foot shows a wound and has an avulsed nail probably causing the wound. Metabolic panel shows hyponatremia 124 and hypokalemia of 2.3. Glucose of 193. WBC of 12 hemoglobin 17.7. Since patient was complaining of nausea vomiting and poor appetite CT abdomen pelvis was done which did not show anything acute in the abdomen but did show cavitary lesion in the right costophrenic angle concerning for necrotizing pneumonia/neoplasm/fungal lesions or tuberculosis. Cultures obtained and patient was placed on airborne precautions and admitted for further workup   Subjective: Patient seen and examined Patient has been very sleepy not interactive not having respiratory complaint Able to wake up indicating he is hungry. Has not been able to take orally, core track ordered especially for his flucytosine Overnight still tachycardic, on 2 L nasal cannula BP hypertensive. 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  ID and pulmonary following closely-holding ART for now. Patient had  blood culture with cryptococcus, Staph capitis likely contamination, CT chest multilobar cavitary mass and plan for bronchoscopy.  TTE without vegetation Continue TB workup ABC 10 x 3 pending, continue antibiotics for pulmonary mass Continue Amphotericin and flucytosine-which he has not received due to n.p.o. status "trach ordered 6/3 Continue Unasyn, follow respiratory culture Plan is for repeat LP and has been ordered discussed with ID Continue airborne precaution.  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  Sodium slightly trending up.  Monitor  Hypertensive urgency  Sinus tachycardia-in the setting of AIDS/cryptococcal meningitis bacteremia: BP well-controlled but tachycardic, continue with current metoprolol every 6 hours IV, MONITOR  Right great toe wound: Cont wound care  Erythrocytosis: Monitor labs.   Cachexia/severe malnutrition Body mass index is 18.75 kg/m.: Will do core track will benefit with tube feeding  DVT prophylaxis: Place and maintain sequential compression device Start: 07/01/23 0500 Code Status:   Code Status: Full Code Family Communication: plan of care discussed with patient at bedside. Patient status is: Remains hospitalized because of severity of illness Level of care: Progressive   Dispo: The patient is from: home, recently in [place]            Anticipated disposition: TBD Objective: Vitals last 24 hrs: Vitals:   07/04/23 1000 07/04/23 1030 07/04/23 1100 07/04/23 1130  BP: (!) 132/94 (!) 127/94 119/82 (!) 135/102  Pulse:    (!) 112  Resp: (!) 22 (!) 25 (!) 22 (!) 25  Temp:      TempSrc:      SpO2:    96%  Weight:      Height:        Physical Examination: General exam: Lethargic thin frail cachexia, old than stated age HEENT:Oral mucosa moist, Ear/Nose WNL  grossly Respiratory system: Bilaterally clear BS, no use of accessory muscle Cardiovascular system: S1 & S2 +. Gastrointestinal system: Abdomen soft, NT,ND,BS+ Nervous System: Follows commands nods for response Extremities: LE edema neg, warm extremities Skin: No rashes,warm. MSK: Normal muscle bulk/tone.   Data Reviewed: I have personally reviewed following labs and imaging studies ( see epic result tab) CBC: Recent Labs  Lab 06/30/23 1829 07/01/23 0015 07/03/23 0549 07/04/23 0427  WBC 12.0* 9.8 10.4 7.3  NEUTROABS  --  8.7*  --   --   HGB 17.7* 17.5* 15.0 15.2  HCT 50.8 50.8 44.8 45.3  MCV 85.7 84.8 89.4 87.5  PLT 240 223 101* 124*   CMP: Recent Labs  Lab 06/30/23 1829 07/01/23 0015 07/01/23 0840 07/01/23 1602 07/02/23 0353 07/03/23 0542 07/04/23 0427  NA 124*   < > 123* 125* 126* 128* 129*  K 2.3*   < > 2.6* 2.8* 3.3* 3.1* 3.2*  CL 75*   < > 81* 82* 86* 90* 90*  CO2 34*   < > 31 32 26 26 26   GLUCOSE 193*   < > 134* 140* 170* 165* 85  BUN 27*   < > 15 13 15 15 11   CREATININE 0.91   < > 0.65 0.52* 0.78 0.56* 0.68  CALCIUM 9.4   < > 8.6* 8.9 9.2 8.2* 8.3*  MG 2.4  --  2.0  --  1.8 2.0 2.1   < > = values in this interval not displayed.   GFR: Estimated Creatinine Clearance: 108.6 mL/min (by C-G formula based on SCr of 0.68 mg/dL). Recent Labs  Lab 06/30/23 1829 07/04/23 0427  AST 35 57*  ALT 24 10  ALKPHOS 92 56  BILITOT 1.3* 1.3*  PROT 8.3* 6.1*  ALBUMIN 4.1 2.6*   No results for input(s): "LIPASE", "AMYLASE" in the last 168 hours. No results for input(s): "AMMONIA" in the last 168 hours. Coagulation Profile: No results for input(s): "INR", "PROTIME" in the last 168 hours. Unresulted Labs (From admission, onward)     Start     Ordered   07/04/23 1051  Culture, fungus without smear  Once,   R        07/04/23 1051   07/04/23 0000  Cryptococcal antigen, CSF  R        07/04/23 1051   07/04/23 0000  CSF cell count with differential  R        07/04/23 1051    07/04/23 0000  Protein, CSF  R        07/04/23 1051   07/04/23 0000  Glucose, CSF  R        07/04/23 1051   07/03/23 0059  Fungus Culture With Stain  Once,   R        07/03/23 0059   07/02/23 1608  Fungus Culture With Stain  Once,   R        07/02/23 1608   07/02/23 1604  Acid Fast Smear (AFB)  (AFB smear + Culture w reflexed sensitivities with precautions panel)  Once,   R       Placed in "And" Linked Group   07/02/23 1604   07/02/23 1604  Acid Fast Culture with reflexed sensitivities  (AFB smear + Culture w reflexed sensitivities with precautions panel)  Once,  R       Placed in "And" Linked Group   07/02/23 1604   07/01/23 1816  Legionella Pneumophila Serogp 1 Ur Ag  Once,   R        07/01/23 1815   07/01/23 1649  Histoplasma antigen, urine  Once,   R        07/01/23 1649   07/01/23 1649  Blastomyces Antigen  Once,   R        07/01/23 1649   06/30/23 2311  QuantiFERON-TB Gold Plus  Once,   R        06/30/23 2310   06/30/23 2310  Fungitell Beta-D-Glucan  Once,   R        06/30/23 2309   06/30/23 2305  MTB-RIF NAA with AFB Culture, sputum (q8 x 3)  Now then every 8 hours,   R     Question Answer Comment  Specimen Source: INDUCED SPUTUM   Patient immune status Immunocompromised      06/30/23 2304   06/30/23 2252  RNA, PCR (Graph) rfx/Geno EDI  Once,   R        06/30/23 2251           Antimicrobials/Microbiology: Anti-infectives (From admission, onward)    Start     Dose/Rate Route Frequency Ordered Stop   07/02/23 1215  flucytosine (ANCOBON) capsule 2,000 mg        2,000 mg Oral Every 6 hours 07/02/23 1115     07/02/23 1200  Ampicillin-Sulbactam (UNASYN) 3 g in sodium chloride 0.9 % 100 mL IVPB        3 g 200 mL/hr over 30 Minutes Intravenous Every 6 hours 07/02/23 1029     07/02/23 1200  flucytosine (ANCOBON) capsule 1,750 mg  Status:  Discontinued        25 mg/kg  68 kg Oral Every 6 hours 07/02/23 1032 07/02/23 1115   07/02/23 1115  piperacillin-tazobactam  (ZOSYN) IVPB 3.375 g  Status:  Discontinued        3.375 g 12.5 mL/hr over 240 Minutes Intravenous Every 8 hours 07/02/23 1026 07/02/23 1029   07/02/23 1000  azithromycin  (ZITHROMAX ) tablet 500 mg  Status:  Discontinued        500 mg Oral Daily 07/01/23 1024 07/02/23 0951   07/02/23 1000  azithromycin  (ZITHROMAX ) 250 mg in dextrose 5 % 125 mL IVPB  Status:  Discontinued        250 mg 127.5 mL/hr over 60 Minutes Intravenous Every 24 hours 07/02/23 0951 07/02/23 1029   07/02/23 0600  flucytosine (ANCOBON) capsule 1,500 mg  Status:  Discontinued        1,500 mg Oral Every 6 hours 07/02/23 0329 07/02/23 1032   07/02/23 0430  amphotericin B liposome (AMBISOME) 200 mg in dextrose 5 % 500 mL IVPB        3 mg/kg  68 kg 275 mL/hr over 120 Minutes Intravenous Every 24 hours 07/02/23 0329     07/01/23 2200  amoxicillin-clavulanate (AUGMENTIN) 875-125 MG per tablet 1 tablet  Status:  Discontinued        1 tablet Oral Every 12 hours 07/01/23 1815 07/02/23 0951   07/01/23 0500  azithromycin  (ZITHROMAX ) 500 mg in sodium chloride 0.9 % 250 mL IVPB  Status:  Discontinued        500 mg 250 mL/hr over 60 Minutes Intravenous Every 24 hours 07/01/23 0353 07/01/23 1024   07/01/23 0400  cefTRIAXone  (ROCEPHIN ) 2 g in sodium chloride 0.9 %  100 mL IVPB  Status:  Discontinued        2 g 200 mL/hr over 30 Minutes Intravenous Every 24 hours 07/01/23 0353 07/01/23 1815         Component Value Date/Time   SDES  07/02/2023 1805    BLOOD RIGHT HAND Performed at [facility], [address] [place], [place] [address]    SPECREQUEST  07/02/2023 1805    BOTTLES DRAWN AEROBIC AND ANAEROBIC Blood Culture adequate volume Performed at [facility], [address] [place], [place] [address]    CULT  07/02/2023 1805    NO GROWTH < 24 HOURS Performed at [facility], [address] [place], [place] [address]    REPTSTATUS PENDING 07/02/2023 1805     Procedures: Procedure(s) (LRB): ENDOBRONCHIAL ULTRASOUND (EBUS) (Right) Medications reviewed:  Scheduled Meds:  dextrose  10 mL Intravenous Q24H   dextrose  10 mL Intravenous Q24H   flucytosine  2,000 mg Oral Q6H   metoprolol tartrate  10 mg Intravenous Q6H   mupirocin ointment   Topical BID   nystatin  5 mL Oral QID   pantoprazole  (PROTONIX ) IV  40 mg Intravenous Q24H   sodium chloride  500 mL Intravenous Q24H   sodium chloride  500 mL Intravenous Q24H   Continuous Infusions:  amphotericin B liposome (AMBISOME) 200 mg in dextrose 5 % 500 mL IVPB Stopped (07/04/23 0628)   ampicillin-sulbactam (UNASYN) IV 3 g (07/04/23 1126)   potassium chloride 10 mEq (07/04/23 1219)    Lesa Rape, MD [facility] 07/04/2023, 1:03 PM

## 2023-07-04 NOTE — Transfer of Care (Signed)
 Immediate Anesthesia Transfer of Care Note  Patient: Gerald Guzman  Procedure(s) Performed: ENDOBRONCHIAL ULTRASOUND (EBUS) (Right) BRONCHOSCOPY, WITH BRUSH BIOPSY  Patient Location: PACU  Anesthesia Type:General  Level of Consciousness: sedated  Airway & Oxygen Therapy: Patient Spontanous Breathing and Patient connected to face mask oxygen  Post-op Assessment: Report given to RN and Post -op Vital signs reviewed and stable  Post vital signs: Reviewed and stable  Last Vitals:  Vitals Value Taken Time  BP    Temp    Pulse    Resp    SpO2      Last Pain:  Vitals:   07/04/23 1347  TempSrc: Tympanic  PainSc: 0-No pain         Complications:  Encounter Notable Events  Notable Event Outcome Phase Comment  Difficult to intubate - unexpected  Intraprocedure Filed from anesthesia note documentation.

## 2023-07-04 NOTE — Progress Notes (Signed)
 SLP Cancellation Note  Patient Details Name: Gerald Guzman MRN: [id number] DOB: 12-22-1975   Cancelled treatment:       Reason Eval/Treat Not Completed: (P) Patient at procedure or test/unavailable (pt having a bronch at this time; will follow up at this time)  Maudie Sorrow, MS [facility] SLP Acute Rehab Services Office [phone]   Chantal Comment 07/04/2023, 2:01 PM

## 2023-07-04 NOTE — Procedures (Signed)
 CORTRAK TUBE INSERTION   A 10 F Cortrak tube has been placed. The tube was secured at the Left nare at 65 cm by a member of the Cortrak tube team. The tube tip is stomach.  X-ray is required, abdominal x-ray has been ordered. Please confirm tube placement before using the Cortrak tube.    If the tube becomes dislodged please keep the tube and contact the Rapid Response team for replacement.  Lacretia Piccolo, RN 07/04/2023 5:30 PM Procedures

## 2023-07-04 NOTE — Anesthesia Preprocedure Evaluation (Signed)
 Anesthesia Evaluation  Patient identified by MRN, date of birth, ID band Patient awake    Reviewed: Allergy & Precautions, NPO status , Patient's Chart, lab work & pertinent test results  Airway Mallampati: II  TM Distance: >3 FB Neck ROM: Full    Dental  (+) Dental Advisory Given, Poor Dentition   Pulmonary pneumonia, Current Smoker and Patient abstained from smoking.   Pulmonary exam normal breath sounds clear to auscultation       Cardiovascular hypertension, Pt. on medications Normal cardiovascular exam Rhythm:Regular Rate:Normal     Neuro/Psych negative neurological ROS  negative psych ROS   GI/Hepatic negative GI ROS,,,(+)     substance abuse  alcohol use  Endo/Other  negative endocrine ROS    Renal/GU negative Renal ROS  negative genitourinary   Musculoskeletal negative musculoskeletal ROS (+)    Abdominal   Peds  Hematology  (+) HIV (noncompliant with HAART)  Anesthesia Other Findings 47 yom w/ HIV who has not been taking his antiretrovirals for many years has been in jail for last 2 months, feeling very weak dizzy 2-3 days. Now with disseminated cryptococcal neoformans bacteremia with meningitis and cavitary pulmonary mass-rule out TB.   Reproductive/Obstetrics                             Anesthesia Physical Anesthesia Plan  ASA: 3  Anesthesia Plan: General   Post-op Pain Management:    Induction: Intravenous and Rapid sequence  PONV Risk Score and Plan: 1 and Midazolam, Dexamethasone and Ondansetron   Airway Management Planned: Oral ETT  Additional Equipment:   Intra-op Plan:   Post-operative Plan: Extubation in OR  Informed Consent: I have reviewed the patients History and Physical, chart, labs and discussed the procedure including the risks, benefits and alternatives for the proposed anesthesia with the patient or authorized representative who has indicated  his/her understanding and acceptance.     Dental advisory given  Plan Discussed with: CRNA  Anesthesia Plan Comments:        Anesthesia Quick Evaluation

## 2023-07-04 NOTE — Progress Notes (Addendum)
 Pharmacy Antibiotic Note  Gerald Guzman is a 48 y.o. male admitted on 06/30/2023 with cavitary lung lesion/disseminated cryptococcal disease. PMH significant for HIV.  Pharmacy has been consulted for Ambisome and Flucytosine dosing. Scr stable today at 0.68. K low at 3.2 after 8 runs of potassium yesterday. Mg 2.1 after no replacement yesterday. Patient is not able to safely swallow so team working on enteral access.   Plan: Ambisome 200mg  (3mg /kg) IV q24h Flucytosine 2000 mg po q6h> Rounded up to get closer to 25 mg/kg per dose  (Notably patient NPO so has not received any of this medication)  Potassium 50 mEq IV this AM- Will plan for another 40 mEq this afternoon  No magnesium replacement today Pharmacy managing electrolytes while on amphotericin B   Height: 6\' 3"  (190.5 cm) Weight: 68 kg (150 lb) IBW/kg (Calculated) : 84.5  Temp (24hrs), Avg:99 F (37.2 C), Min:98.6 F (37 C), Max:99.9 F (37.7 C)  Recent Labs  Lab 06/30/23 1829 07/01/23 0015 07/01/23 0840 07/01/23 1602 07/02/23 0353 07/03/23 0542 07/03/23 0549 07/04/23 0427  WBC 12.0* 9.8  --   --   --   --  10.4 7.3  CREATININE 0.91 0.78 0.65 0.52* 0.78 0.56*  --  0.68    Estimated Creatinine Clearance: 108.6 mL/min (by C-G formula based on SCr of 0.68 mg/dL).    No Known Allergies  Antimicrobials this admission: 6/1 Ceftriaxone  x 1 6/1 Augmentin >>  6/2 6/1 Azithromycin  >>6/2 6/2 Unasyn> 6/2 Ambisome >> 6/2 Flucytosine >>   Thank you for allowing pharmacy to be a part of this patient's care.  Denson Flake, PharmD, BCPS, BCIDP Infectious Diseases Clinical Pharmacist Phone: [phone] 07/04/2023 8:09 AM

## 2023-07-04 NOTE — Progress Notes (Signed)
 Initial Nutrition Assessment  INTERVENTION:   Monitor magnesium, potassium, and phosphorus for at least 3 days, MD to replete as needed, as pt is at risk for refeeding syndrome.  Once Cortrak placed and placement verified: -Initiate Osmolite 1.5 @ 20 ml/hr, advance by 10 ml every 12 hours to goal rate of 65 ml/hr. -60 ml Prosource TF 20 daily -Provides 2340 kcals, 117g protein and 1188 ml H2O -Free water recommendations:  150 ml every 4 hours (900 ml)  -Multivitamin with minerals daily  NUTRITION DIAGNOSIS:   Increased nutrient needs related to chronic illness as evidenced by estimated needs.  GOAL:   Patient will meet greater than or equal to 90% of their needs  MONITOR:   Diet advancement, TF tolerance  REASON FOR ASSESSMENT:   Consult Assessment of nutrition requirement/status  ASSESSMENT:   86 yom w/ HIV who has not been taking his antiretrovirals for many years has been in jail for last 2 months, feeling very weak dizzy 2-3 days PTA and he stumbled and hurt his right foot great toe while walking down the stairs presented to ED. Of note for last 3 days PTA he has been placed back on his antihypertensives and antiretroviral but having poor appetite last 3 days with nausea vomiting. 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  Patient with multiple procedures planned possibly today. Pt to have bronchoscopy. Cortrak tube has been ordered and awaiting placement. Pt to have another LP. GI wanting EGD prior to discharge.  Pt with cryptococcal meningitis. H/o HIV currently being worked up for TB. Has been in jail for last 2 months, per officer report pt eating poorly but did eat 6/1. Per SLP evaluation 6/3, pt with high risk of aspiration and dysphagia recommending pt remain NPO. Pt would benefit from tube feeds until safely can resume PO. Will leave recommendations above.  Per weight records, no recent weights available to assess weight changes.   Current weight: 150 lbs Continue to monitor weight trends.   Medications: KCl  Labs reviewed: Low Na Low K   NUTRITION - FOCUSED PHYSICAL EXAM:  Unable to complete at this time  Diet Order:   Diet Order             Diet NPO time specified Except for: Ice Chips  Diet effective now                   EDUCATION NEEDS:   Not appropriate for education at this time  Skin:  Skin Assessment: Reviewed RN Assessment  Last BM:  6/2  Height:   Ht Readings from Last 1 Encounters:  06/30/23 6\' 3"  (1.905 m)    Weight:   Wt Readings from Last 1 Encounters:  06/30/23 68 kg    BMI:  Body mass index is 18.75 kg/m.  Estimated Nutritional Needs:   Kcal:  2300-2500  Protein:  105-120g  Fluid:  2.3L/day   Arna Better, MS, RD, LDN Inpatient Clinical Dietitian Contact via Secure chat

## 2023-07-05 ENCOUNTER — Encounter (HOSPITAL_COMMUNITY): Payer: Self-pay | Admitting: Emergency Medicine

## 2023-07-05 ENCOUNTER — Inpatient Hospital Stay (HOSPITAL_COMMUNITY)

## 2023-07-05 DIAGNOSIS — B451 Cerebral cryptococcosis: Secondary | ICD-10-CM | POA: Insufficient documentation

## 2023-07-05 DIAGNOSIS — K229 Disease of esophagus, unspecified: Secondary | ICD-10-CM

## 2023-07-05 DIAGNOSIS — J189 Pneumonia, unspecified organism: Secondary | ICD-10-CM | POA: Diagnosis not present

## 2023-07-05 DIAGNOSIS — G9341 Metabolic encephalopathy: Secondary | ICD-10-CM | POA: Insufficient documentation

## 2023-07-05 DIAGNOSIS — J984 Other disorders of lung: Secondary | ICD-10-CM | POA: Diagnosis not present

## 2023-07-05 LAB — CBC
HCT: 43.8 % (ref 39.0–52.0)
Hemoglobin: 14.6 g/dL (ref 13.0–17.0)
MCH: 29.4 pg (ref 26.0–34.0)
MCHC: 33.3 g/dL (ref 30.0–36.0)
MCV: 88.3 fL (ref 80.0–100.0)
Platelets: 123 10*3/uL — ABNORMAL LOW (ref 150–400)
RBC: 4.96 MIL/uL (ref 4.22–5.81)
RDW: 11.8 % (ref 11.5–15.5)
WBC: 6.6 10*3/uL (ref 4.0–10.5)
nRBC: 0 % (ref 0.0–0.2)

## 2023-07-05 LAB — BASIC METABOLIC PANEL WITH GFR
Anion gap: 12 (ref 5–15)
BUN: 19 mg/dL (ref 6–20)
CO2: 25 mmol/L (ref 22–32)
Calcium: 8.6 mg/dL — ABNORMAL LOW (ref 8.9–10.3)
Chloride: 93 mmol/L — ABNORMAL LOW (ref 98–111)
Creatinine, Ser: 0.88 mg/dL (ref 0.61–1.24)
GFR, Estimated: >60 mL/min (ref >60)
Glucose, Bld: 122 mg/dL — ABNORMAL HIGH (ref 70–99)
Potassium: 3.6 mmol/L (ref 3.5–5.1)
Sodium: 130 mmol/L — ABNORMAL LOW (ref 135–145)

## 2023-07-05 LAB — HIV-1 RNA, PCR (GRAPH) RFX/GENO EDI
HIV-1 RNA BY PCR: 136000 {copies}/mL
HIV-1 RNA Quant, Log: 5.134 {Log_copies}/mL

## 2023-07-05 LAB — GLUCOSE, CAPILLARY
Glucose-Capillary: 139 mg/dL — ABNORMAL HIGH (ref 70–99)
Glucose-Capillary: 140 mg/dL — ABNORMAL HIGH (ref 70–99)
Glucose-Capillary: 181 mg/dL — ABNORMAL HIGH (ref 70–99)

## 2023-07-05 LAB — CULTURE, BLOOD (ROUTINE X 2): Special Requests: ADEQUATE

## 2023-07-05 LAB — MAGNESIUM
Magnesium: 2.3 mg/dL (ref 1.7–2.4)
Magnesium: 2.2 mg/dL (ref 1.7–2.4)

## 2023-07-05 LAB — BLASTOMYCES ANTIGEN
Blastomyces Antigen: NOT DETECTED ng/mL
Interpretation: NEGATIVE

## 2023-07-05 LAB — REFLEX TO GENOSURE(R) MG EDI

## 2023-07-05 LAB — PNEUMOCYSTIS JIROVECI SMEAR BY DFA: Pneumocystis jiroveci Ag: NEGATIVE

## 2023-07-05 LAB — PHOSPHORUS
Phosphorus: 3.5 mg/dL (ref 2.5–4.6)
Phosphorus: 2.7 mg/dL (ref 2.5–4.6)

## 2023-07-05 LAB — ACID FAST SMEAR (AFB, MYCOBACTERIA): Acid Fast Smear: NEGATIVE

## 2023-07-05 LAB — MRSA NEXT GEN BY PCR, NASAL: MRSA by PCR Next Gen: NOT DETECTED

## 2023-07-05 LAB — HISTOPLASMA ANTIGEN, URINE: Histoplasma Antigen, urine: NEGATIVE (ref <0.2)

## 2023-07-05 MED ORDER — ORAL CARE MOUTH RINSE: 15.0000 mL | OROMUCOSAL | Status: DC | PRN

## 2023-07-05 MED ORDER — ADULT MULTIVITAMIN W/MINERALS CH
1.0000 | ORAL_TABLET | Freq: Every day | ORAL | Status: DC
  Administered 2023-07-05 – 2023-07-07 (×3): 1
  Filled 2023-07-05 (×3): qty 1

## 2023-07-05 MED ORDER — LACTATED RINGERS IV BOLUS
500.0000 mL | Freq: Once | INTRAVENOUS | Status: AC
  Administered 2023-07-05: 500 mL via INTRAVENOUS

## 2023-07-05 MED ORDER — POTASSIUM CHLORIDE 20 MEQ PO PACK
40.0000 meq | PACK | Freq: Every day | ORAL | Status: DC
  Administered 2023-07-05: 40 meq
  Filled 2023-07-05: qty 2

## 2023-07-05 MED ORDER — OSMOLITE 1.5 CAL PO LIQD
1000.0000 mL | ORAL | Status: DC
  Administered 2023-07-05 – 2023-07-07 (×2): 1000 mL
  Filled 2023-07-05 (×4): qty 1000

## 2023-07-05 MED ORDER — SODIUM CHLORIDE 3 % IN NEBU
4.0000 mL | INHALATION_SOLUTION | Freq: Every day | RESPIRATORY_TRACT | Status: AC
  Administered 2023-07-05 – 2023-07-07 (×3): 4 mL via RESPIRATORY_TRACT
  Filled 2023-07-05 (×3): qty 4

## 2023-07-05 MED ORDER — CHLORHEXIDINE GLUCONATE CLOTH 2 % EX PADS
6.0000 | MEDICATED_PAD | Freq: Every day | CUTANEOUS | Status: DC
  Administered 2023-07-05 – 2023-07-06 (×2): 6 via TOPICAL

## 2023-07-05 MED ORDER — ADULT MULTIVITAMIN W/MINERALS CH: 1.0000 | ORAL_TABLET | Freq: Every day | ORAL | Status: DC

## 2023-07-05 MED ORDER — THIAMINE MONONITRATE 100 MG PO TABS
100.0000 mg | ORAL_TABLET | Freq: Every day | ORAL | Status: DC
  Administered 2023-07-05 – 2023-07-07 (×3): 100 mg
  Filled 2023-07-05 (×3): qty 1

## 2023-07-05 MED ORDER — FLUCYTOSINE 500 MG PO CAPS
2000.0000 mg | ORAL_CAPSULE | Freq: Four times a day (QID) | ORAL | Status: DC
  Administered 2023-07-05 – 2023-07-07 (×8): 2000 mg
  Filled 2023-07-05 (×9): qty 4

## 2023-07-05 MED ORDER — ORAL CARE MOUTH RINSE
15.0000 mL | OROMUCOSAL | Status: DC
Start: 2023-07-05 — End: 2023-07-07
  Administered 2023-07-05 – 2023-07-07 (×7): 15 mL via OROMUCOSAL

## 2023-07-05 MED ORDER — PROSOURCE TF20 ENFIT COMPATIBL EN LIQD
60.0000 mL | Freq: Every day | ENTERAL | Status: DC
Start: 2023-07-05 — End: 2023-07-07
  Administered 2023-07-06 – 2023-07-07 (×2): 60 mL
  Filled 2023-07-05 (×3): qty 60

## 2023-07-05 NOTE — Progress Notes (Signed)
 Pharmacy Antibiotic Note  Gerald Guzman is a 48 y.o. male admitted on 06/30/2023 with cavitary lung lesion/disseminated cryptococcal disease. PMH significant for HIV.  Pharmacy has been consulted for Ambisome and Flucytosine dosing. SCr starting to trend up to 0.88 today > Will watch. K-3.6 after 70 mEq yesterday. Mg today 2.3 after no replacement. Notably patient now has a coretrak and can receive enteral medications.   Plan: Ambisome 200mg  (3mg /kg) IV q24h Flucytosine 2000 mg po q6h> Rounded up to get closer to 25 mg/kg per dose   Start potassium 40 mEq per tube daily for maintenance  No magnesium replacement today Pharmacy managing electrolytes while on amphotericin B   Height: 6\' 3"  (190.5 cm) Weight: 68 kg (150 lb) IBW/kg (Calculated) : 84.5  Temp (24hrs), Avg:98.2 F (36.8 C), Min:97.8 F (36.6 C), Max:98.6 F (37 C)  Recent Labs  Lab 06/30/23 1829 07/01/23 0015 07/01/23 0840 07/01/23 1602 07/02/23 0353 07/03/23 0542 07/03/23 0549 07/04/23 0427 07/05/23 0426  WBC 12.0* 9.8  --   --   --   --  10.4 7.3 6.6  CREATININE 0.91 0.78   < > 0.52* 0.78 0.56*  --  0.68 0.88   < > = values in this interval not displayed.    Estimated Creatinine Clearance: 98.7 mL/min (by C-G formula based on SCr of 0.88 mg/dL).    No Known Allergies  Antimicrobials this admission: 6/1 Ceftriaxone  x 1 6/1 Augmentin >>  6/2 6/1 Azithromycin  >>6/2 6/2 Unasyn> 6/2 Ambisome >> 6/2 Flucytosine >>   Thank you for allowing pharmacy to be a part of this patient's care.  Denson Flake, PharmD, BCPS, BCIDP Infectious Diseases Clinical Pharmacist Phone: [phone] 07/05/2023 8:20 AM

## 2023-07-05 NOTE — Progress Notes (Signed)
 CPT on hold due to pt sleeping order Q4 WA.

## 2023-07-05 NOTE — Progress Notes (Addendum)
 SLP Cancellation Note  Patient Details Name: Gerald Guzman MRN: [id number] DOB: 09/20/1975   Cancelled treatment:       Reason Eval/Treat Not Completed: Other (comment);Fatigue/lethargy limiting ability to participate (RN reports pt currently lethargic and did not sleep last night; he now has Cortrak;  Per Chest CT, pt with "Diffuse fluid-filled esophagus with circumferential wall thickening, worrisome for an infectious or inflammatory esophagitis", non-emergent endoscopy advised but on hold due to respiratory issues; will continue efforts)  Maudie Sorrow, MS [facility] SLP Acute Rehab Services Office [phone]   Chantal Comment 07/05/2023, 8:15 AM

## 2023-07-05 NOTE — Progress Notes (Signed)
   NAME:  Gerald Guzman, MRN:  [id number], DOB:  Jun 21, 1975, LOS: 5 ADMISSION DATE:  06/30/2023, CONSULTATION DATE: 07/01/2023 REFERRING MD: Dr. Joan Mouton, CHIEF COMPLAINT: Cavitary lung disease  History of Present Illness:  Patient was [facility] in [facility] the prison [facility]   Her histo   Was complaining of some abdominal pain, did have abdominal CT showing a right lower lobe cavitary process Had a CT scan of the chest showing cavitary process in the right lower lobe cavitary process, right middle lobe cavitary mass, subcarinal adenopathy   He is very sleepy and not very interactive at present Was not having any respiratory complaints History of HIV disease, has not been compliant with treatment  Pertinent  Medical History   Past Medical History:  Diagnosis Date   Alcoholism (HCC) 02/01/2016   HIV infection (HCC)    Hypertension    Significant Hospital Events: Including procedures, antibiotic start and stop dates in addition to other pertinent events   CT 07/01/2023-right middle lobe cavitary process, right lower lobe cavitary process with subcarinal adenopathy 6/2-lumbar puncture, noted high opening pressures, crypto positive 6/4 bronchoscopy with transbronchial brushings, BAL right middle lobe   Interim History / Subjective:  Underwent bronchoscopy 6/4 without incident, transbronchial brushings, BAL performed and pending More lethargic, has not received sedation   Objective    Blood pressure (!) 142/107, pulse (!) 115, temperature 97.8 F (36.6 C), temperature source Oral, resp. rate 18, height 6\' 3"  (1.905 m), weight 68 kg, SpO2 90%.        Intake/Output Summary (Last 24 hours) at 07/05/2023 1029 Last data filed at 07/05/2023 0506 Gross per 24 hour  Intake 4332.86 ml  Output --  Net 4332.86 ml   Filed Weights   06/30/23 1752  Weight: 68 kg    Examination: General: Chronically ill-appearing man laying in bed, Neuro: Quite lethargic.  Wakes to voice, will track.  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  QuantiFERON gold was negative - Continue current antimicrobials as per ID  HIV/AIDS, cryptococcal meningitis Lethargy and encephalopathy due to meningitis - Continue Amphotericin and flucytosine - At risk for impaired airway protection, possible respiratory failure due to his lethargy.  Would minimize any sedating medications.  Follow wakefulness, airway protection closely - ID following  Esophageal thickening - Will need further evaluation going forward once stable from these issues.   Racheal Buddle, MD, PhD 07/05/2023, 10:29 AM [facility] [phone] or if no answer before 7:00PM call [phone] For any issues after 7:00PM please call eLink [phone]

## 2023-07-05 NOTE — Progress Notes (Signed)
 [facility] Gastroenterology Progress Note  Gerald Guzman 47 y.o. Dec 27, 1975  CC: Abnormal CT scan concerning for esophageal wall thickening   Subjective: Patient seen and examined at bedside.  He remains lethargic.  Not able to provide any history.  ROS : Not able to obtain   Objective: Vital signs in last 24 hours: Vitals:   07/04/23 1630 07/05/23 0344  BP:    Pulse: (!) 115   Resp: 18   Temp: 97.8 F (36.6 C)   SpO2:  90%    Physical Exam: Patient is lethargic, sleepy.  Not able to provide any history.  Abdominal exam benign.  Lab Results: Recent Labs    07/04/23 0427 07/05/23 0418 07/05/23 0426  NA 129*  --  130*  K 3.2*  --  3.6  CL 90*  --  93*  CO2 26  --  25  GLUCOSE 85  --  122*  BUN 11  --  19  CREATININE 0.68  --  0.88  CALCIUM 8.3*  --  8.6*  MG 2.1 2.3  --    Recent Labs    07/04/23 0427  AST 57*  ALT 10  ALKPHOS 56  BILITOT 1.3*  PROT 6.1*  ALBUMIN 2.6*   Recent Labs    07/04/23 0427 07/05/23 0426  WBC 7.3 6.6  HGB 15.2 14.6  HCT 45.3 43.8  MCV 87.5 88.3  PLT 124* 123*   No results for input(s): "LABPROT", "INR" in the last 72 hours.    Assessment/Plan: -Abnormal CT scan concerning for esophagitis in a patient with finding concerning for large cavitary lung lesion as well as cryptococcal meningitis. - History of HIV.  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 - He remains lethargic from his cryptococcal meningitis - Recommend to continue supportive care for now. - Consider EGD prior to discharge depending on his overall clinical outcome. - GI will Follow-up next week.   Felecia Hopper MD, FACP 07/05/2023, 9:06 AM  Contact #  [phone]

## 2023-07-05 NOTE — Progress Notes (Signed)
 Pt refused some of night time medications due to wanting to leave hospital AMA. Pt expressed to this RN that he wanted to eat and has not had anything in 4 days.This RN explained the reasoning for orders being NPO and the plan to start tube feeding once orders are placed. Pt expressed that he was not going to get tube feed nor any medicines and that he needed to leave ASAP. Officer at bedside notified of pt's request along with on call NP, Consulting civil engineer and [facility]. This RN also explain the reasoning for needing his medications as his BP and HR was elevated, needing PRN's that he eventually let this RN give after sustaining a headache. Pt also complained of SOB with o2 sats dropping to the 70's; upon walking into room, pt had taken off o2 and was laying on his side. O2 placed back on with minimal variation in sats. Bumped up o2 to 5L and o2 sats waxed and waned from high 70's to mid 80's. On call NP notified as well and RT called to give PRN breathing treatment. O2 sats came back to 90's afterward.

## 2023-07-05 NOTE — Progress Notes (Signed)
 PCCM INTERVAL PROGRESS NOTE  Patient transferred to ICU for closer monitoring following an event this morning with lethargy and hypoxia. Reportedly the patient has been on 6L [place] with sats in the  90s, but suddenly desaturated into the low 80s despite 10 L [place]. Non-rebreather was placed and he had some improvement. Cough becoming productive for thick white secretions.   At the time of my evaluation the patient is awake and alert. Complaining of dyspnea and productive cough. He remains on non-rebreather and O2 sats are in the high 90s. Breath sounds diminished in the right base.   CXR from this morning reviewed with effusion vs atelectasis in the right base.   Plan: - trial of heated high flow - Incentive spirometry, chest PT, hypertonic nebs.  - Repeat CXR - Will eval R lung base with ultrasound ? If effusion could be tapped to improve symptoms.     Roz Cornelia, AGACNP-BC [facility]  See Amion for personal pager PCCM on call pager [phone] until 7pm. Please call Elink 7p-7a. [phone]  07/05/2023 12:28 PM

## 2023-07-05 NOTE — Progress Notes (Signed)
 PROGRESS NOTE DEVLON DOSHER  ZOX:[id number] DOB: 02/09/1975 DOA: 06/30/2023 PCP: Pcp, No  Brief Narrative/Hospital Course: 60 yom w/ HIV who has not been taking his antiretrovirals for many years has been in jail for last 2 months, feeling very weak dizzy 2-3 days PTA and he stumbled and hurt his right foot great toe while walking down the stairs presented to ED. Of note for last 3 days PTA he has been placed back on his antihypertensives and antiretroviral but having poor appetite last 3 days with nausea vomiting.  In the ER,  patient appears generally weak cachectic nonfocal. X-ray of the right foot shows a wound and has an avulsed nail probably causing the wound. Metabolic panel shows hyponatremia 124 and hypokalemia of 2.3. Glucose of 193. WBC of 12 hemoglobin 17.7. Since patient was complaining of nausea vomiting and poor appetite CT abdomen pelvis was seen in rapid response due to hypoxia which did not show anything acute in the abdomen but did show cavitary lesion in the right costophrenic angle concerning for necrotizing pneumonia/neoplasm/fungal lesions or tuberculosis. 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  ID and pulmonary following  closely-holding ART for now. Patient had blood culture with cryptococcus, Staph capitis likely contamination, CT chest multilobar cavitary mass Underwent bronchoscopy await cytology and culture data AFB negative and sputum so far QuantiFERON gold was negative so okay to come off airborne precaution per pulmonary  TTE without vegetation Continue Amphotericin and flucytosine v/I cortrak, cont iv unasyn Plan is for repeat LP under fluoroscopy due to high opening pressure and follow-up cryptococcal and other CSF studies 6/5  Acute encephalopathy due to patient's meningitis.  Continue supportive core Trak tube feeding, RD following  Acute hypoxic respiratory failure: This is due to patient's pulmonary mass/infection.  On nonrebreather transferred to stepdown pulmonary following  Esophageal thickening: GI following closely plan for endoscopy before discharge  Severe hypokalemia  Resolved  Hyponatremia : Improving, HCTZ discontinued. 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  Recent Labs  Lab 07/01/23 1602 07/02/23 0353 07/03/23 0542 07/04/23 0427 07/05/23 0426  NA 125* 126* 128* 129* 130*    Hypertensive urgency  Sinus tachycardia-in the setting of AIDS/cryptococcal meningitis bacteremia: BP well-controlled but tachycardic, continue with current metoprolol every 6 hours IV> plan to change to p.o. with core track  Right great toe wound: Cont wound care  Erythrocytosis: Monitor labs.  Cachexia/severe malnutrition Body mass index is 18.75 kg/m.: Ng tube feed once resp status better RD following   DVT prophylaxis: Place and maintain sequential compression device Start: 07/01/23 0500 Code Status:   Code Status: Full Code Family Communication: plan of care discussed with patient at bedside. Patient status is: Remains hospitalized because of severity of illness Level of care: Stepdown   Dispo: The patient is from: home, recently in [place] officer outside room             Anticipated  disposition: TBD Objective: Vitals last 24 hrs: Vitals:   07/04/23 1535 07/04/23 1550 07/04/23 1630 07/05/23 0344  BP:  (!) 142/107    Pulse:  (!) 118 (!) 115   Resp: (!) 22  18   Temp:   97.8 F (36.6 C)   TempSrc:   Oral   SpO2:  98%  90%  Weight:      Height:        Physical Examination: General exam: Lethargic, nods for response cachectic and thin HEENT:Oral mucosa moist, Ear/Nose WNL grossly Respiratory system: Bilaterally diminished  Cardiovascular system: S1 & S2 +, No JVD. Gastrointestinal system: Abdomen soft,NT,ND, BS+ Nervous System: Lethargic,moving all extremities,and following commands. Extremities: LE edema neg,distal peripheral pulses palpable and warm.  Skin: No rashes,no icterus. MSK: Temporal wasting thin   Data Reviewed: I have personally reviewed following labs and imaging studies ( see epic result tab) CBC: Recent Labs  Lab 06/30/23 1829 07/01/23 0015 07/03/23 0549 07/04/23 0427 07/05/23 0426  WBC 12.0* 9.8 10.4 7.3 6.6  NEUTROABS  --  8.7*  --   --   --   HGB 17.7* 17.5* 15.0 15.2 14.6  HCT 50.8 50.8 44.8 45.3 43.8  MCV 85.7 84.8 89.4 87.5 88.3  PLT 240 223 101* 124* 123*   CMP: Recent Labs  Lab 07/01/23 0840 07/01/23 1602 07/02/23 0353 07/03/23 0542 07/04/23 0427 07/05/23 0418 07/05/23 0426 07/05/23 1009  NA 123* 125* 126* 128* 129*  --  130*  --   K 2.6* 2.8* 3.3* 3.1* 3.2*  --  3.6  --   CL 81* 82* 86* 90* 90*  --  93*  --   CO2 31 32 26 26 26   --  25  --   GLUCOSE 134* 140* 170* 165* 85  --  122*  --   BUN 15 13 15 15 11   --  19  --   CREATININE 0.65 0.52* 0.78 0.56* 0.68  --  0.88  --   CALCIUM 8.6* 8.9 9.2 8.2* 8.3*  --  8.6*  --   MG 2.0  --  1.8 2.0 2.1 2.3  --   --   PHOS  --   --   --   --   --   --   --  3.5   GFR: Estimated Creatinine Clearance: 98.7 mL/min (by C-G formula based on SCr of 0.88 mg/dL). Recent Labs  Lab 06/30/23 1829 07/04/23 0427  AST 35 57*  ALT 24 10  ALKPHOS 92 56  BILITOT  1.3* 1.3*  PROT 8.3* 6.1*  ALBUMIN 4.1 2.6*   No results for input(s): "LIPASE", "AMYLASE" in the last 168 hours. No results for input(s): "AMMONIA" in the last 168 hours. Coagulation Profile: No results for input(s): "INR", "PROTIME" in the last 168 hours. Unresulted Labs (From admission, onward)     Start     Ordered   07/06/23 0500  Magnesium  Daily,   R     Question:  Specimen collection method  Answer:  Lab=Lab collect   07/05/23 0824   07/05/23 1700  Magnesium  (ICU Tube Feeding: PEPuP )  5A & 5P,   R (with TIMED occurrences)     Question:  Specimen collection method  Answer:  Lab=Lab collect   07/05/23 0940   07/05/23 1213  MRSA Next Gen by PCR, Nasal  Once,   R        07/05/23 1212   07/05/23 0941  Phosphorus  (ICU Tube Feeding: PEPuP )  5A & 5P,   R (with TIMED occurrences)     Question:  Specimen collection method  Answer:  Lab=Lab collect   07/05/23 0940   07/05/23 0500  Basic metabolic panel with GFR  Daily,   R     Question:  Specimen collection method  Answer:  Lab=Lab collect   07/04/23 1643   07/05/23 0500  CBC  Daily,   R     Question:  Specimen collection method  Answer:  Lab=Lab collect   07/04/23 1643   07/04/23 1642  Cryptococcal antigen, CSF  Once,   R        07/04/23 1643   07/04/23 1444  Fungus Culture With Stain  RELEASE UPON ORDERING,   TIMED       Comments: Specimen C: Specimen Description RML BAL Phone [phone]         Previous Biopsy:  no Is the patient on airborne/droplet precautions? Yes           Clinical History:  HIV Copy of Report to:  N/A Specimen Disposition: Microbiology     07/04/23 1444   07/04/23 1444  Acid Fast Culture with reflexed sensitivities  RELEASE UPON ORDERING,   TIMED       Comments: Specimen C: Specimen Description RML BAL Phone [phone]         Previous Biopsy:  no Is the patient on airborne/droplet precautions? Yes           Clinical History:  HIV Copy of Report to:  N/A Specimen Disposition: Microbiology      07/04/23 1444   07/04/23 1444  Acid Fast Smear (AFB)  RELEASE UPON ORDERING,   TIMED       Comments: Specimen C: Specimen Description RML BAL Phone [phone]         Previous Biopsy:  no Is the patient on airborne/droplet precautions? Yes           Clinical History:  HIV Copy of Report to:  N/A Specimen Disposition: Microbiology     07/04/23 1444   07/04/23 1428  Fungus Culture With Stain  RELEASE UPON ORDERING,   TIMED       Comments: Specimen A: Specimen Description tracheal aspirate Phone [phone]         Previous Biopsy:  no Is the patient on airborne/droplet precautions? Yes           Clinical History:  HIV Copy of Report to:  N/A Specimen Disposition: Microbiology     07/04/23 1428   07/04/23 1428  Acid Fast Culture with reflexed sensitivities  RELEASE UPON ORDERING,   TIMED       Comments: Specimen A: Specimen Description tracheal aspirate Phone [phone]         Previous Biopsy:  no Is the patient on airborne/droplet precautions? Yes           Clinical History:  HIV Copy of Report to:  N/A Specimen Disposition: Microbiology     07/04/23 1428   07/04/23 1428  Acid Fast Smear (AFB)  RELEASE UPON ORDERING,   TIMED       Comments: Specimen A: Specimen Description tracheal aspirate Phone [phone]         Previous Biopsy:  no Is the patient on airborne/droplet precautions? Yes           Clinical History:  HIV Copy of Report to:  N/A Specimen Disposition: Microbiology     07/04/23 1428   07/04/23 1051  Culture, fungus without smear  Once,   R        07/04/23 1051   07/04/23 0000  Cryptococcal antigen, CSF  R        07/04/23 1051   07/04/23 0000  CSF cell count with differential  R        07/04/23 1051   07/04/23 0000  Protein, CSF  R        07/04/23 1051   07/04/23 0000  Glucose, CSF  R        07/04/23 1051   07/02/23 1608  Fungus Culture With Stain  Once,   R        07/02/23 1608   07/02/23 1604  Acid Fast Smear (AFB)  (AFB smear + Culture w reflexed  sensitivities with precautions panel)  Once,   R       Placed in "And" Linked Group   07/02/23 1604   07/02/23 1604  Acid Fast Culture with reflexed sensitivities  (AFB smear + Culture w reflexed sensitivities with precautions panel)  Once,   R       Placed in "And" Linked Group   07/02/23 1604   07/01/23 1649  Histoplasma antigen, urine  Once,   R        07/01/23 1649   06/30/23 2305  MTB-RIF NAA with AFB Culture, sputum (q8 x 3)  Now then every 8 hours,   R (with TIMED occurrences)     Question Answer Comment  Specimen Source: INDUCED SPUTUM   Patient immune status Immunocompromised      06/30/23 2304   06/30/23 2252  RNA, PCR (Graph) rfx/Geno EDI  Once,   R        06/30/23 2251           Antimicrobials/Microbiology: Anti-infectives (From admission, onward)    Start     Dose/Rate Route Frequency Ordered Stop   07/05/23 1800  flucytosine (ANCOBON) capsule 2,000 mg        2,000 mg Per Tube Every 6 hours 07/05/23 1245     07/02/23 1215  flucytosine (ANCOBON) capsule 2,000 mg  Status:  Discontinued        2,000 mg Oral Every 6 hours 07/02/23 1115 07/05/23 1245   07/02/23 1200  Ampicillin-Sulbactam (UNASYN) 3 g in sodium chloride 0.9 % 100 mL IVPB        3 g 200 mL/hr over 30 Minutes Intravenous Every 6 hours 07/02/23 1029     07/02/23 1200  flucytosine (ANCOBON) capsule 1,750 mg  Status:  Discontinued        25 mg/kg  68 kg Oral Every 6 hours 07/02/23 1032 07/02/23 1115   07/02/23 1115  piperacillin-tazobactam (ZOSYN) IVPB 3.375 g  Status:  Discontinued        3.375 g 12.5 mL/hr over 240 Minutes Intravenous Every 8 hours 07/02/23 1026 07/02/23 1029   07/02/23 1000  azithromycin  (ZITHROMAX ) tablet 500 mg  Status:  Discontinued        500 mg Oral Daily 07/01/23 1024 07/02/23 0951   07/02/23 1000  azithromycin  (ZITHROMAX ) 250 mg in dextrose 5 % 125 mL IVPB  Status:  Discontinued        250 mg 127.5 mL/hr over 60 Minutes Intravenous Every 24 hours 07/02/23 0951 07/02/23 1029    07/02/23 0600  flucytosine (ANCOBON) capsule 1,500 mg  Status:  Discontinued        1,500 mg Oral Every 6 hours 07/02/23 0329 07/02/23 1032   07/02/23 0430  amphotericin B liposome (AMBISOME)  200 mg in dextrose 5 % 500 mL IVPB        3 mg/kg  68 kg 275 mL/hr over 120 Minutes Intravenous Every 24 hours 07/02/23 0329     07/01/23 2200  amoxicillin-clavulanate (AUGMENTIN) 875-125 MG per tablet 1 tablet  Status:  Discontinued        1 tablet Oral Every 12 hours 07/01/23 1815 07/02/23 0951   07/01/23 0500  azithromycin  (ZITHROMAX ) 500 mg in sodium chloride 0.9 % 250 mL IVPB  Status:  Discontinued        500 mg 250 mL/hr over 60 Minutes Intravenous Every 24 hours 07/01/23 0353 07/01/23 1024   07/01/23 0400  cefTRIAXone  (ROCEPHIN ) 2 g in sodium chloride 0.9 % 100 mL IVPB  Status:  Discontinued        2 g 200 mL/hr over 30 Minutes Intravenous Every 24 hours 07/01/23 0353 07/01/23 1815         Component Value Date/Time   SDES  07/04/2023 1443    BRONCHIAL ALVEOLAR LAVAGE RML Performed at [facility], [address], [place], [place] [address]    SDES  07/04/2023 1443    BRONCHIAL ALVEOLAR LAVAGE RML Performed at [facility], [address] [place], [place] [address]    SPECREQUEST  07/04/2023 1443    NONE Performed at [facility], [address] [place], [place] [address]    SPECREQUEST  07/04/2023 1443    NONE Performed at [facility], [address], [place], [place] [address]    CULT PENDING 07/04/2023 1443   CULT PENDING 07/04/2023 1443   REPTSTATUS PENDING 07/04/2023 1443   REPTSTATUS PENDING 07/04/2023 1443    Procedures: Procedure(s) (LRB): ENDOBRONCHIAL ULTRASOUND (EBUS) (Right) BRONCHOSCOPY, WITH BRUSH BIOPSY IRRIGATION, BRONCHUS BRONCHOSCOPY, WITH NEEDLE ASPIRATION BIOPSY Medications reviewed:  Scheduled Meds:  Chlorhexidine Gluconate Cloth  6 each Topical Daily   dextrose  10 mL  Intravenous Q24H   dextrose  10 mL Intravenous Q24H   feeding supplement (PROSource TF20)  60 mL Per Tube Daily   flucytosine  2,000 mg Per Tube Q6H   metoprolol tartrate  10 mg Intravenous Q6H   multivitamin with minerals  1 tablet Per Tube Daily   mupirocin ointment   Topical BID   nystatin  5 mL Oral QID   mouth rinse  15 mL Mouth Rinse 4 times per day   pantoprazole  (PROTONIX ) IV  40 mg Intravenous Q24H   potassium chloride  40 mEq Per Tube Daily   sodium chloride  500 mL Intravenous Q24H   sodium chloride  500 mL Intravenous Q24H   sodium chloride HYPERTONIC  4 mL Nebulization Daily   thiamine  100 mg Per Tube Daily   Continuous Infusions:  amphotericin B liposome (AMBISOME) 200 mg in dextrose 5 % 500 mL IVPB 200 mg (07/05/23 0539)   ampicillin-sulbactam (UNASYN) IV 3 g (07/05/23 0548)   feeding supplement (OSMOLITE 1.5 CAL) Stopped (07/05/23 1230)    Lesa Rape, MD [facility] 07/05/2023, 12:47 PM

## 2023-07-05 NOTE — Progress Notes (Signed)
 While providing patient care, writer noted patient's oxygen saturation level began to drop below 90% on 6L [place]. Patient's oxygen saturation level remained below 90% despite increase to 10L [place]. Writer contacted respiratory therapist to come bedside to assess patient. Patient's breathing became more labored. Writer requested attending MD, charge nurse, and rapid response nurse to come bedside to assess patient. Patient was subsequently transferred to ICU for further evaluation and treatment.

## 2023-07-05 NOTE — Plan of Care (Signed)
   Problem: Nutrition: Goal: Adequate nutrition will be maintained Outcome: Progressing   Problem: Pain Managment: Goal: General experience of comfort will improve and/or be controlled Outcome: Progressing   Problem: Safety: Goal: Ability to remain free from injury will improve Outcome: Progressing

## 2023-07-05 NOTE — Progress Notes (Addendum)
 Brief Nutrition Support Update  Patient had Cortrak placed yesterday evening. Per abdominal xray, tip in the stomach.  Per discussion with attending Dr. Bobbetta Burnet, can initiate tube feeds today. Plan to start at 13mL/hr and advance slowly due to refeeding risk. Discussed with RN.  Interventions: Initiate tube feeding via Cortrak (tip in stomach): Osmolite 1.5 at 65 ml/h (1560 ml per day) *Start at 20 ml/hr and advance by 10 ml every 12 hours Prosource TF20 60 ml daily + 150 ml every 4 hour FWF (900 ml) Provides 2420 kcal, 118 gm protein, 2088 ml free water daily  - Monitor magnesium, potassium, and phosphorus for at least 3 days, MD to replete as needed, as pt is at risk for refeeding syndrome.  - Add 100mg  thiamine x5 days for risk of refeeding.  - Add Multivitamin with minerals daily  - Monitor weigh trends closely.   Scheryl Cushing RD, LDN Contact via [employer].

## 2023-07-05 NOTE — Anesthesia Postprocedure Evaluation (Signed)
 Anesthesia Post Note  Patient: Gerald Guzman  Procedure(s) Performed: ENDOBRONCHIAL ULTRASOUND (EBUS) (Right) BRONCHOSCOPY, WITH BRUSH BIOPSY IRRIGATION, BRONCHUS BRONCHOSCOPY, WITH NEEDLE ASPIRATION BIOPSY     Patient location during evaluation: PACU Anesthesia Type: General Level of consciousness: awake and alert Pain management: pain level controlled Vital Signs Assessment: post-procedure vital signs reviewed and stable Respiratory status: spontaneous breathing, nonlabored ventilation, respiratory function stable and patient connected to nasal cannula oxygen Cardiovascular status: blood pressure returned to baseline and stable Postop Assessment: no apparent nausea or vomiting Anesthetic complications: yes  Encounter Notable Events  Notable Event Outcome Phase Comment  Difficult to intubate - unexpected  Intraprocedure Filed from anesthesia note documentation.    Last Vitals:  Vitals:   07/04/23 1630 07/05/23 0344  BP:    Pulse: (!) 115   Resp: 18   Temp: 36.6 C   SpO2:  90%    Last Pain:  Vitals:   07/05/23 0312  TempSrc:   PainSc: 2                  [name]

## 2023-07-05 NOTE — Progress Notes (Signed)
 RT called by RN to assess pt with desaturation.  Pt had been on 6-7L [place] with sats in 90s per RN.  Pt was on 10L [place] with sats of 82% and dropping upon entering room.  Pt was put on 100% NRB and repositioned in bed and suctioned orally. PTs sats slowly came up to 93% with suctioning pt via his mouth after coughing.  Pt had productive congested cough of white thick mucus.  Rapid response nurse at bedside and pt transported to ICU. Pt is now in room 1239.  RRT given report.

## 2023-07-05 NOTE — Progress Notes (Signed)
 Rt placed [facility] on pt due to low sats. Pt is a rapid from the floor 1441.

## 2023-07-06 ENCOUNTER — Encounter (HOSPITAL_COMMUNITY): Payer: Self-pay | Admitting: Internal Medicine

## 2023-07-06 ENCOUNTER — Inpatient Hospital Stay (HOSPITAL_COMMUNITY)

## 2023-07-06 DIAGNOSIS — G9341 Metabolic encephalopathy: Secondary | ICD-10-CM

## 2023-07-06 DIAGNOSIS — K229 Disease of esophagus, unspecified: Secondary | ICD-10-CM | POA: Diagnosis not present

## 2023-07-06 DIAGNOSIS — Z7189 Other specified counseling: Secondary | ICD-10-CM

## 2023-07-06 DIAGNOSIS — Z21 Asymptomatic human immunodeficiency virus [HIV] infection status: Secondary | ICD-10-CM

## 2023-07-06 DIAGNOSIS — Z515 Encounter for palliative care: Secondary | ICD-10-CM

## 2023-07-06 DIAGNOSIS — R627 Adult failure to thrive: Secondary | ICD-10-CM

## 2023-07-06 DIAGNOSIS — B451 Cerebral cryptococcosis: Secondary | ICD-10-CM | POA: Diagnosis not present

## 2023-07-06 DIAGNOSIS — Z66 Do not resuscitate: Secondary | ICD-10-CM

## 2023-07-06 DIAGNOSIS — R590 Localized enlarged lymph nodes: Secondary | ICD-10-CM

## 2023-07-06 HISTORY — DX: Encounter for palliative care: Z51.5

## 2023-07-06 LAB — GLUCOSE, CAPILLARY
Glucose-Capillary: 169 mg/dL — ABNORMAL HIGH (ref 70–99)
Glucose-Capillary: 177 mg/dL — ABNORMAL HIGH (ref 70–99)
Glucose-Capillary: 190 mg/dL — ABNORMAL HIGH (ref 70–99)
Glucose-Capillary: 197 mg/dL — ABNORMAL HIGH (ref 70–99)
Glucose-Capillary: 203 mg/dL — ABNORMAL HIGH (ref 70–99)
Glucose-Capillary: 236 mg/dL — ABNORMAL HIGH (ref 70–99)

## 2023-07-06 LAB — CRYPTOCOCCAL ANTIGEN
Crypto Ag: POSITIVE — AB
Cryptococcal Ag Titer: >2560 — AB

## 2023-07-06 LAB — BASIC METABOLIC PANEL WITH GFR
Anion gap: 10 (ref 5–15)
BUN: 16 mg/dL (ref 6–20)
CO2: 26 mmol/L (ref 22–32)
Calcium: 8.5 mg/dL — ABNORMAL LOW (ref 8.9–10.3)
Chloride: 97 mmol/L — ABNORMAL LOW (ref 98–111)
Creatinine, Ser: 0.7 mg/dL (ref 0.61–1.24)
GFR, Estimated: >60 mL/min (ref >60)
Glucose, Bld: 170 mg/dL — ABNORMAL HIGH (ref 70–99)
Potassium: 3 mmol/L — ABNORMAL LOW (ref 3.5–5.1)
Sodium: 133 mmol/L — ABNORMAL LOW (ref 135–145)

## 2023-07-06 LAB — ACID FAST SMEAR (AFB, MYCOBACTERIA)
Acid Fast Smear: POSITIVE — AB
Acid Fast Smear: POSITIVE — AB

## 2023-07-06 LAB — CBC
HCT: 42.2 % (ref 39.0–52.0)
Hemoglobin: 14.4 g/dL (ref 13.0–17.0)
MCH: 29.8 pg (ref 26.0–34.0)
MCHC: 34.1 g/dL (ref 30.0–36.0)
MCV: 87.4 fL (ref 80.0–100.0)
Platelets: 129 10*3/uL — ABNORMAL LOW (ref 150–400)
RBC: 4.83 MIL/uL (ref 4.22–5.81)
RDW: 11.4 % — ABNORMAL LOW (ref 11.5–15.5)
WBC: 6.3 10*3/uL (ref 4.0–10.5)
nRBC: 0 % (ref 0.0–0.2)

## 2023-07-06 LAB — CRYPTOCOCCAL ANTIGEN, CSF
Crypto Ag: POSITIVE — AB
Crypto Ag: POSITIVE — AB
Cryptococcal Ag Titer: >=1:2560 {titer} — AB
Cryptococcal Ag Titer: >=1:2560 {titer} — AB

## 2023-07-06 LAB — PHOSPHORUS
Phosphorus: 2.7 mg/dL (ref 2.5–4.6)
Phosphorus: 2.2 mg/dL — ABNORMAL LOW (ref 2.5–4.6)

## 2023-07-06 LAB — MAGNESIUM
Magnesium: 2 mg/dL (ref 1.7–2.4)
Magnesium: 2 mg/dL (ref 1.7–2.4)

## 2023-07-06 MED ORDER — ACETAMINOPHEN 160 MG/5ML PO SOLN
650.0000 mg | Freq: Four times a day (QID) | ORAL | Status: DC | PRN
  Administered 2023-07-06 (×2): 650 mg via ORAL
  Filled 2023-07-06 (×2): qty 20.3

## 2023-07-06 MED ORDER — METOPROLOL TARTRATE 25 MG PO TABS
50.0000 mg | ORAL_TABLET | Freq: Two times a day (BID) | ORAL | Status: DC
  Administered 2023-07-06 (×2): 50 mg via NASOGASTRIC
  Filled 2023-07-06 (×3): qty 2

## 2023-07-06 MED ORDER — POTASSIUM CHLORIDE 20 MEQ PO PACK
40.0000 meq | PACK | Freq: Two times a day (BID) | ORAL | Status: DC
  Administered 2023-07-06 (×2): 40 meq
  Filled 2023-07-06 (×2): qty 2

## 2023-07-06 MED ORDER — METOPROLOL TARTRATE 5 MG/5ML IV SOLN: 5.0000 mg | Freq: Four times a day (QID) | INTRAVENOUS | Status: DC | PRN

## 2023-07-06 MED ORDER — ACETAMINOPHEN 160 MG/5ML PO SOLN: 650.0000 mg | Freq: Every day | ORAL | Status: DC | PRN

## 2023-07-06 MED ORDER — LABETALOL HCL 5 MG/ML IV SOLN
10.0000 mg | Freq: Once | INTRAVENOUS | Status: AC
  Administered 2023-07-06: 10 mg via INTRAVENOUS
  Filled 2023-07-06: qty 4

## 2023-07-06 NOTE — Progress Notes (Signed)
 NAME:  Gerald Guzman, MRN:  [id number], DOB:  May 20, 1975, LOS: 6 ADMISSION DATE:  06/30/2023, CONSULTATION DATE: 07/01/2023 REFERRING MD: Dr. Joan Mouton, CHIEF COMPLAINT: Cavitary lung disease  History of Present Illness:  Patient was brought in from the prison system   Was complaining of some abdominal pain, did have abdominal CT showing a right lower lobe cavitary process Had a CT scan of the chest showing cavitary process in the right lower lobe cavitary process, right middle lobe cavitary mass, subcarinal adenopathy   He is very sleepy and not very interactive at present Was not having any respiratory complaints History of HIV disease, has not been compliant with treatment  Pertinent  Medical History   Past Medical History:  Diagnosis Date   Alcoholism (HCC) 02/01/2016   HIV infection (HCC)    Hypertension    Significant Hospital Events: Including procedures, antibiotic start and stop dates in addition to other pertinent events   CT 07/01/2023-right middle lobe cavitary process, right lower lobe cavitary process with subcarinal adenopathy 6/2-lumbar puncture, noted high opening pressures, crypto positive 6/4 bronchoscopy with transbronchial brushings, BAL right middle lobe 6/5 txf to SDU with worse resp status 6/6 incr pulm hygiene measures    Interim History / Subjective:   NAEO  Objective    Blood pressure (!) 169/101, pulse (!) 117, temperature 97.9 F (36.6 C), temperature source Oral, resp. rate 19, height 6\' 3"  (1.905 m), weight 68 kg, SpO2 98%.    FiO2 (%):  [24 %-60 %] 24 %   Intake/Output Summary (Last 24 hours) at 07/06/2023 0931 Last data filed at 07/06/2023 0400 Gross per 24 hour  Intake 3017.55 ml  Output 700 ml  Net 2317.55 ml   Filed Weights   06/30/23 1752  Weight: 68 kg    Examination: General: Chronically and acutely ill middle aged M NAD  Neuro: Very lethargic, oriented x 2 (maybe 3, difficult to know with a weak voice)  HENT: Dry mm. Anicteric  sclera  Lungs: Scattered rhonchi bilaterally, diminished bases. 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 Awaiting bronch results but with encapsulated yeast alt dx cryptococcus more likely and is off airborne now  P -ID is following  -ampho + flucytosine  -unasyn -follow bronch studies  -CPT, IS, flutter -O2 for SpO2 goal >92 -CXR in AM  -high risk for further decompensation related to mental status and respiratory status with sepsis 2/2 disseminated dz.  6/6 pt has indicated that pt would want his uncle to be his decision maker if he were to be unable to make decisions.    -we are being informed that in the event that the patient cannot make decisions, at that time (and not before) the sheriffs dept would need to be engaged to then receive their "permission" to attempt to contact pts surrogate decision maker--which logistically is absurd and presents potential for patient harm. If the patient were to decline and life or death decisions were needed to be make, emergent procedures needing  consent etc, it would be in the best interest of the patient's safety to be allowed to contact his designated surrogate decision maker (uncle) readily and without  logistical delays related to obtaining said permission.        High MDM    Eston Hence MSN, AGACNP-BC [facility]  for pager  07/06/2023, 9:31 AM

## 2023-07-06 NOTE — Consult Note (Addendum)
 Consultation Note Date: 07/06/2023   Patient Name: Gerald Guzman  DOB: [birth year]  MRN: [id number]  Age / Sex: 48 y.o., male   PCP: Pcp, No Referring Physician: Lesa Rape, MD  Reason for Consultation: Establishing goals of care     Chief Complaint/History of Present Illness:   Patient is a 48 year old male with a past medical history of HIV who has not been taking antiretrovirals for many years and has been in jail for the last 2 months who was transferred to hospital on 06/02/2023 after he had stumbled and hurt his foot.  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  Patient has also had acute encephalopathy.  Extensive review of EMR prior to presenting to bedside.  Discussed care with PCCM providers to coordinate care.  Also discussed care with RN for medical updates.  Patient had lumbar puncture performed this morning for workup of meningitis.  Patient's overall medical status continues to rapidly deteriorate.  Concern about patient's ability to survive hospitalization.  After speaking with PCCM providers, this provider was able to call director of nursing at the [facility], [place].  She had already spoken with PCCM provider.  Discussed patient has not been officially convicted of crime by the court system and is awaiting trial.  Since patient is not convicted and fully incarcerated for his crime, patient and family able to participate in making medical decisions.  The court system would not be responsible for making decisions regarding medical interventions.  Patient can be confused at times and unable to participate in complex medical decision making.    Able to speak with patient's uncle, Gerald Guzman,  who patient specifically requested be called.  Reduced myself and the role of the palliative medicine team and patient's medical journey.  Provided medical updates about patient's critical illness.  Expressed concern about patient's ability to survive hospitalization.  Noted continuing appropriate medical interventions at this time.  With permission, able to discuss CODE STATUS.  Expressed concern that should patient deteriorate to the point that his heart stops or he stops breathing, interventions such as cardiac and respiratory resuscitation with maintaining life support on ventilator would cause suffering and not improve patient's overall quality of life with his underlying medical conditions.  Uncle agreed with this.  Discussed will appropriately change patient's CODE STATUS to DNR/DNI.  Uncle supporting this decision as well. Will to discuss other family involved in patient's life.  Patient is not married.  Uncle noted that patient has 2 other uncles, Gerald Guzman and Gerald Guzman.  Patient also has 2 sisters, Gerald Guzman, Gerald Guzman and Gerald Guzman.  Uncle also noted that patient has 3 children though uncle does not know their names due to social circumstances.  Acknowledged this.  Uncle is planning to update other family members.  Uncle inquiring about if family can come to visit.  Noted would reach out to deputies present at bedside regarding this.  Spent time providing emotional support via active listening.  All questions answered at that time.  Noted palliative medicine team continue to follow with patient's medical journey.  Able to speak with diabetes present at patient's bedside.  Given contact information for first to [facility], [phone].  PCCM providers are planning to reach out to first [employer] to discuss possibilities of family visiting with patient due  to critical illness.  Discussed care with team throughout the day to coordinate care planning.  Palliative medicine team  continuing to follow with patient's medical journey.  Update: Presented to bedside in afternoon after informed by RN that patient's sister had called wanting to speak to provider.  Attempted to interact with patient though he is lethargic.  RN noted that patient had stated that patient's sister did not know about his HIV status.  Noted could update her regarding medical condition though would not disclose this.  Attempted to call patient's Sister Gerald Guzman, no answer.  Will continue to attempt to involving conversation as appropriate.  Primary Diagnoses  Present on Admission:  HIV disease (HCC)  Benign essential HTN  Alcoholism (HCC)   Past Medical History:  Diagnosis Date   Alcoholism (HCC) 02/01/2016   HIV infection (HCC)    Hypertension    Social History   Socioeconomic History   Marital status: Single    Spouse name: Not on file   Number of children: Not on file   Years of Gerald: Not on file   Highest Gerald level: Not on file  Occupational History   Not on file  Tobacco Use   Smoking status: Every Day    Current packs/day: 1.00    Types: Cigarettes   Smokeless tobacco: Never   Tobacco comments:    "I'll just stop them when I'm ready, but not yet"  Substance and Sexual Activity   Alcohol use: Yes    Alcohol/week: 0.0 standard drinks of alcohol    Comment: 2-3 40 oz beers a day   Drug use: Yes    Types: Marijuana   Sexual activity: Not Currently    Partners: Female    Birth control/protection: Condom    Comment: declined condoms  Other Topics Concern   Not on file  Social History Narrative   Not on file   Social Drivers of Health   Financial Resource Strain: Not on file  Food Insecurity: Patient Declined (07/01/2023)   Hunger Vital Sign    Worried About Running Out of Food in the Last Year: Patient declined    Ran Out of Food in the Last Year: Patient declined  Transportation Needs: Not on file  Physical Activity: Not on file  Stress: Not on file  Social  Connections: Not on file   Family History  Problem Relation Age of Onset   Other Mother        murdered with gunshot   COPD Maternal Grandmother    Heart disease Maternal Grandmother    Other Maternal Grandfather    Scheduled Meds:  Chlorhexidine Gluconate Cloth  6 each Topical Daily   dextrose  10 mL Intravenous Q24H   dextrose  10 mL Intravenous Q24H   feeding supplement (PROSource TF20)  60 mL Per Tube Daily   flucytosine  2,000 mg Per Tube Q6H   metoprolol tartrate  50 mg Per NG tube BID   multivitamin with minerals  1 tablet Per Tube Daily   mupirocin ointment   Topical BID   nystatin  5 mL Oral QID   mouth rinse  15 mL Mouth Rinse 4 times per day   pantoprazole  (PROTONIX ) IV  40 mg Intravenous Q24H   potassium chloride  40 mEq Per Tube BID   sodium chloride  500 mL Intravenous Q24H   sodium chloride  500 mL Intravenous Q24H   sodium chloride HYPERTONIC  4 mL Nebulization Daily   thiamine  100 mg Per Tube  Daily   Continuous Infusions:  amphotericin B liposome (AMBISOME) 200 mg in dextrose 5 % 500 mL IVPB 200 mg (07/06/23 1031)   ampicillin-sulbactam (UNASYN) IV Stopped (07/06/23 0618)   feeding supplement (OSMOLITE 1.5 CAL) 20 mL/hr at 07/06/23 0400   PRN Meds:.acetaminophen , acetaminophen , diphenhydrAMINE **OR** diphenhydrAMINE, hydrALAZINE, labetalol, levalbuterol, meperidine (DEMEROL) injection, metoprolol tartrate, mouth rinse No Known Allergies CBC:    Component Value Date/Time   WBC 6.3 07/06/2023 0320   HGB 14.4 07/06/2023 0320   HCT 42.2 07/06/2023 0320   PLT 129 (L) 07/06/2023 0320   MCV 87.4 07/06/2023 0320   NEUTROABS 8.7 (H) 07/01/2023 0015   LYMPHSABS 0.5 (L) 07/01/2023 0015   MONOABS 0.6 07/01/2023 0015   EOSABS 0.0 07/01/2023 0015   BASOSABS 0.0 07/01/2023 0015   Comprehensive Metabolic Panel:    Component Value Date/Time   NA 133 (L) 07/06/2023 0320   K 3.0 (L) 07/06/2023 0320   CL 97 (L) 07/06/2023 0320   CO2 26 07/06/2023 0320   BUN 16  07/06/2023 0320   CREATININE 0.70 07/06/2023 0320   CREATININE 1.23 04/16/2017 1101   GLUCOSE 170 (H) 07/06/2023 0320   CALCIUM 8.5 (L) 07/06/2023 0320   AST 57 (H) 07/04/2023 0427   ALT 10 07/04/2023 0427   ALKPHOS 56 07/04/2023 0427   BILITOT 1.3 (H) 07/04/2023 0427   PROT 6.1 (L) 07/04/2023 0427   ALBUMIN 2.6 (L) 07/04/2023 0427    Physical Exam: Vital Signs: BP (!) 169/101   Pulse (!) 117   Temp 97.9 F (36.6 C) (Oral)   Resp 19   Ht 6\' 3"  (1.905 m)   Wt 68 kg   SpO2 97%   BMI 18.75 kg/m  SpO2: SpO2: 97 % O2 Device: O2 Device: Nasal Cannula O2 Flow Rate: O2 Flow Rate (L/min): 3 L/min Intake/output summary:  Intake/Output Summary (Last 24 hours) at 07/06/2023 1058 Last data filed at 07/06/2023 0400 Gross per 24 hour  Intake 3017.55 ml  Output 700 ml  Net 2317.55 ml   LBM: Last BM Date : 07/06/23 Baseline Weight: Weight: 68 kg Most recent weight: Weight: 68 kg  General: Lethargic, ill-appearing Cardiovascular: Tachycardia noted Respiratory: increased work of breathing noted Skin: no rashes or lesions on visible skin Neuro: Lethargic         Palliative Performance Scale: 10%              Additional Data Reviewed: Recent Labs    07/05/23 0426 07/06/23 0320  WBC 6.6 6.3  HGB 14.6 14.4  PLT 123* 129*  NA 130* 133*  BUN 19 16  CREATININE 0.88 0.70    Imaging: DG Chest 1 View CLINICAL DATA:  Oxygen desaturation.  Shortness of breath.  EXAM: CHEST  1 VIEW  COMPARISON:  Same-day radiograph dated 07/05/2023 at 10:21 a.m.  FINDINGS: The heart size and mediastinal contours are unchanged. Enteric tube tip in the stomach. Similar right mid to lower lung volume loss, which could reflect moderate-sized pleural effusion with atelectasis and/or collapse. Persistent right mid to upper lung opacity. Left lung is clear. No pneumothorax identified. The visualized osseous structures are unchanged.  IMPRESSION: 1. Similar right mid to lower lung volume loss, which  could reflect a moderate-sized pleural effusion with atelectasis and/or collapse. 2. Persistent right mid to upper lung opacity.  Electronically Signed   By: Mannie Seek M.D.   On: 07/05/2023 13:13 DG Chest Port 1 View CLINICAL DATA:  Shortness of breath.  EXAM: PORTABLE CHEST 1 VIEW  COMPARISON:  July 04, 2023.  FINDINGS: The heart size and mediastinal contours are within normal limits. Left lung is clear. Feeding tube is seen in proximal stomach. Moderate right pleural effusion is noted with right upper lobe opacity concerning for pneumonia or atelectasis. The visualized skeletal structures are unremarkable.  IMPRESSION: Moderate right pleural effusion is noted with right upper lobe opacity concerning for pneumonia or atelectasis.  Electronically Signed   By: Rosalene Colon M.D.   On: 07/05/2023 10:27    I personally reviewed recent imaging.   Palliative Care Assessment and Plan Summary of Established Goals of Care and Medical Treatment Preferences   Patient is a 48 year old male with a past medical history of HIV who has not been taking antiretrovirals for many years and has been in jail for the last 2 months who was transferred to hospital on 06/02/2023 after he had stumbled and hurt his foot.  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  Patient has also had acute encephalopathy.  # Complex medical decision making/goals of care Patient has severe medical illness including cryptococcal neoformans bacteremia and meningitis, cavitary pneumonia mass, and acute hypoxic respiratory failure.  - Patient unable to participate in complex medical decision-making due to underlying medical status.  -  Discussed care with director of nursing at jail.  Patient has not been convicted of his crime and is not under the care of the justice system; patient just being held in custody.  In the setting, patient and family are allowed to assist in medical decision making.  Due to patient's medical status, spoke to patient's uncle, as patient requested, regarding medical updates and seriousness of patient's illness.  Expressed concern that patient may not survive hospitalization.  Uncle to update family which includes: 3 uncles total, patient's 2 sisters, and potentially patient's 3 children though uncle does not know their names or have their contact information.    - Discussed will continue appropriate medical interventions at this time.  Expressed concern that if patient was sick and [facility] his heart were to stop or he restart breathing, interventions such as cardiac and respiratory resuscitation would cause suffering and not allow patient to return to a good quality of life.  Discussed CODE STATUS of full code versus DNR.  Uncle agreeing with supporting change of CODE STATUS to DNR/DNI.  -  Code Status: Limited: Do not attempt resuscitation (DNR) -DNR-LIMITED -Do Not Intubate/DNI    # Psycho-social/Spiritual Support:  - Support System: Gerald Guzman, Gerald Guzman, uncle- Gerald Guzman,*-Gerald Guzman, sister-Gerald Guzman, 3 children though uncle did not know contact information for children  # Discharge Planning:  To Be Determined  Thank you for allowing the palliative care team to participate in the care Gerald Guzman.  Gerald Libel, DO Palliative Care Provider PMT # [phone]  If patient remains symptomatic despite maximum doses, please call PMT at [phone] between 0700 and 1900. Outside of these hours, please call attending, as PMT does not have night coverage.  Personally spent 95 minutes in patient care including extensive chart review (labs, imaging, progress/consult notes,  vital signs), medically appropraite exam, discussed with treatment team, Gerald to patient, family, and staff, documenting clinical information, medication review and management, coordination of care, and available advanced directive documents.

## 2023-07-06 NOTE — Plan of Care (Signed)
 Went for repeat LP today.  Continues to be very hypertensive and tachycardic and lethargic.  Oral mucosa and tongue look much healthier but he still produces a lot of secretions which he's unable to effectively clear and requires oral suctioning.  Received permission from Solicitor at [facility] to notify family of hospitalization.  Code status changed to DNR/DNI.

## 2023-07-06 NOTE — Progress Notes (Signed)
 CPT on hold Patient sleeping. No signs or symptoms of distress noted.

## 2023-07-06 NOTE — Progress Notes (Signed)
 Patient unavailable for CPT. Emergent procedure.

## 2023-07-06 NOTE — Progress Notes (Signed)
 Pharmacy Antibiotic Note  Gerald Guzman is a 48 y.o. male admitted on 06/30/2023 with cavitary lung lesion/disseminated cryptococcal disease. PMH significant for HIV.  Pharmacy has been consulted for Ambisome and Flucytosine dosing. SCr trending back to 0.70 today. K-3.0 after 40 mEq yesterday. Mg today 2.0 after no replacement. Notably patient is also at risk for refeeding syndrome which could deplete his electrolytes.   Plan: Ambisome 200mg  (3mg /kg) IV q24h Flucytosine 2000 mg po q6h> Rounded up to get closer to 25 mg/kg per dose   Increase maintenance potassium to 40 mEq BID  No magnesium replacement  for now- will monitor  Pharmacy managing electrolytes while on amphotericin B   Height: 6\' 3"  (190.5 cm) Weight: 68 kg (150 lb) IBW/kg (Calculated) : 84.5  Temp (24hrs), Avg:98.2 F (36.8 C), Min:97.9 F (36.6 C), Max:98.6 F (37 C)  Recent Labs  Lab 07/01/23 0015 07/01/23 0840 07/02/23 0353 07/03/23 0542 07/03/23 0549 07/04/23 0427 07/05/23 0426 07/06/23 0320  WBC 9.8  --   --   --  10.4 7.3 6.6 6.3  CREATININE 0.78   < > 0.78 0.56*  --  0.68 0.88 0.70   < > = values in this interval not displayed.    Estimated Creatinine Clearance: 108.6 mL/min (by C-G formula based on SCr of 0.7 mg/dL).    No Known Allergies  Antimicrobials this admission: 6/1 Ceftriaxone  x 1 6/1 Augmentin >>  6/2 6/1 Azithromycin  >>6/2 6/2 Unasyn> 6/2 Ambisome >> 6/2 Flucytosine >>   Thank you for allowing pharmacy to be a part of this patient's care.  Denson Flake, PharmD, BCPS, BCIDP Infectious Diseases Clinical Pharmacist Phone: [phone] 07/06/2023 8:23 AM

## 2023-07-06 NOTE — Plan of Care (Signed)

## 2023-07-06 NOTE — Progress Notes (Signed)
 SLP Cancellation Note  Patient Details Name: Gerald Guzman MRN: [id number] DOB: 08-Jun-1975   Cancelled treatment:       Reason Eval/Treat Not Completed: Other (comment) (RN reports pt is still not managing his secretions and they are getting ready to go to [facility] for LP)  Pt has small bore tube for nutrition, medications. Will continue efforts.   Maudie Sorrow, MS [facility] SLP Acute Rehab Services Office [phone]   Chantal Comment 07/06/2023, 10:55 AM

## 2023-07-06 NOTE — TOC Progression Note (Signed)
 Transition of Care [facility]) - Progression Note    Patient Details  Name: Gerald Guzman MRN: [id number] Date of Birth: October 17, 1975  [facility]) CM/SW Contact  Tessie Fila, RN Phone Number: 07/06/2023, 12:25 PM  Clinical Narrative:    Pt from [facility]. Remains in ICU, currently requiring oxygen therapy. At discharge pt is expected to return to [facility].        Expected Discharge Plan and Services                                               Social Determinants of Health (SDOH) Interventions SDOH Screenings   Food Insecurity: Patient Declined (07/01/2023)  Tobacco Use: High Risk (06/30/2023)    Readmission Risk Interventions    07/01/2023    2:08 PM  Readmission Risk Prevention Plan  Post Dischage Appt Complete  Medication Screening Complete  Transportation Screening Complete

## 2023-07-06 NOTE — Progress Notes (Signed)
 Nutrition Follow-up  DOCUMENTATION CODES:      INTERVENTION:  - Continue advancing tube feeding via Cortrak (tip in stomach) as below: Osmolite 1.5 at 65 ml/h (1560 ml per day) *Advancing by 10 ml every 12 hours Prosource TF20 60 ml daily Provides 2420 kcal, 118 gm protein, 1189 ml free water daily   - Monitor magnesium, potassium, and phosphorus for at least 3 days, MD to replete as needed, as pt is at risk for refeeding syndrome.  - 100mg  thiamine x5 days for risk of refeeding.  - FWF per CCM.   - Multivitamin with minerals daily   - Monitor weight trends.   - Will monitor for ongoing GOC decisions.   NUTRITION DIAGNOSIS:   Increased nutrient needs related to chronic illness as evidenced by estimated needs. *ongoing  GOAL:   Patient will meet greater than or equal to 90% of their needs *progressing, on TF  MONITOR:   Diet advancement, TF tolerance  REASON FOR ASSESSMENT:   Consult Assessment of nutrition requirement/status  ASSESSMENT:   77 yom w/ HIV who has not been taking his antiretrovirals for many years has been in jail for last 2 months, feeling very weak dizzy 2-3 days PTA and he stumbled and hurt his right foot great toe while walking down the stairs presented to ED. Of note for last 3 days PTA he has been placed back on his antihypertensives and antiretroviral but having poor appetite last 3 days with nausea vomiting. 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  5/31 Admit 6/4 Cortrak placed 6/5 TF's started @ trickle  Patient with another provider at time of visit.  TF's initiated last night at trickle and remain at [facility] this AM. Orders are in for advancement.  SLP eval remains pending as patient remains lethargic and not managing his secretions well.  Per CCM note, patient with poor prognosis for survival. Situation is complicated due to patient being from jail. CCM and palliative have reached out to director of  nursing at [employer] and as patient has not been convicted of a crime (awaiting trial), patient/family can participate in marking medical decisions.  Patient remains confused at this time. He has previously requested an uncle be called.  Palliative care discussed with uncle and patient now DNR/DNI. Futher goals of care decisions remains ongoing at this time.   Admit weight: 150# *needs updated weight  Medications reviewed and include: MVI, 100mg  thiamine (x5 days, ends 6/9)  Labs reviewed:  Na 133 K+ 3.0   NUTRITION - FOCUSED PHYSICAL EXAM:  Unable to obtain at this time  Diet Order:   Diet Order             Diet NPO time specified Except for: Ice Chips  Diet effective now                   EDUCATION NEEDS:  Not appropriate for education at this time  Skin:  Skin Assessment: Reviewed RN Assessment  Last BM:  6/6 - type 6  Height:  Ht Readings from Last 1 Encounters:  06/30/23 6\' 3"  (1.905 m)   Weight:  Wt Readings from Last 1 Encounters:  06/30/23 68 kg    BMI:  Body mass index is 18.75 kg/m.  Estimated Nutritional Needs:  Kcal:  2300-2500 Protein:  105-120g Fluid:  2.3L/day    Scheryl Cushing RD, LDN Contact via Secure Chat.

## 2023-07-06 NOTE — Plan of Care (Signed)
 Patients blood pressures have been in the high 180's since 1700, and in the 200's this shift, patient has Hydralazine 10 mg IV order every four hours however it has not been effective, the Labetalol 5 mg IV is for his heart rate of 120 or higher and Lopressor 5 MG IV every 6 hours for heart rate of 130's or higher, which have been effective' patient given Tylenol  650 mg for complaints of headaches, patient remains on antibiotic for infection, bacteria and PNA, lung sounds remain coarse, patient is tolerating his tube feeds of Osmolite 1.5, rate is currently at 40 cc, to be increased to cc on 6/7 at 10 AM, blood glucose monitoring is done every four hours, wound care to toe done twice a day, patient schedule for another lumbar puncture in the AM, and at this time patient remains on airborne/contact precautions.  Problem: Education: Goal: Knowledge of General Education information will improve Description: Including pain rating scale, medication(s)/side effects and non-pharmacologic comfort measures Outcome: Progressing

## 2023-07-06 NOTE — Progress Notes (Signed)
 Discussed with patient whom he would like us  to contact in case he's unable to make medical decisions for himself, he states Gerald Guzman should be his emergency contact. Patient provided phone number for [facility], added as emergency contact.

## 2023-07-06 NOTE — Progress Notes (Signed)
 Spoke with [employer] Bern[employer] Brink re visitation policy. Because of Notnamed's tenuous medical status, visitation would be permissible. I have called pt uncle Mont Antis and notified him of this.   1[employer] Bernida Brink also indicated they are working on trying to release Quandarius's bond, which would release him from their custody.    Eston Hence MSN, AGACNP-BC [facility] Pulmonary/Critical Care Medicine 07/06/2023, 1:53 PM

## 2023-07-06 NOTE — Progress Notes (Signed)
 [facility]  Date of Admission:  06/30/2023   Total days of inpatient antibiotics 5  Principal Problem:   Cryptococcal meningitis (HCC) Active Problems:   HIV disease (HCC)   Benign essential HTN   Alcoholism (HCC)   Near syncope   Hyponatremia   Hypokalemia   Nausea & vomiting   Cavitary pneumonia   Hyperglycemia   Mediastinal adenopathy   Acute metabolic encephalopathy          Assessment: 48 yo male hiv last seen in rcid 2018 of antiviral for many years, brought in from jail (where he had stayed for the last 2 months) with a few days poor appetite, n/v, weakness/dizziness and a ground level fall, with ED workup showing cavitary pna/lung mass:   #Cryptococcal neoformans bacteremia with meningitis, Disseminated #Pulmonary mass with cavitary changes - Blood cultures from admission grew cryptococcus.  Staph capitis as well which is likely contamination. - CT chest showed multilobar cavitary mass in right middle lobe measuring up to 5.6 cm with distal satellite cavitary nodules.  Diffuse fluid-filled esophagus with separation wall thickening. - Serum cryptococcal antigen titer>2560 -She has been on antibiotics for the pulmonary mass, TB workup AFB sputum x 3 pending -TTE no veg - MTB RIF negative x 2(tb complex)t, cx pending, IGRA negative, bronch gram stain +encapsulated yeart, affb Cx pending. As bronch cx stain shows encapsulated yeast, taken off ariborne precautions for tb given alternate diagnosis of cryptococcus -LP on 6/2 with "62+ cm H2O - exceeded the maximum height of manometer " , ME panel+ crypto., yeast of smear and cx -SP bronch on 6/4   #Esophageal thickening #Thrush improving - GI consulted who recommended workup for cavitary lung lesion and encephalopathy prior to doing endoscopy.  GI to follow periodically.  May consider EGD prior to discharge.   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 6/2 + yeast, as such re-order blood Cx - Follow LP studies form 6/2.  -Given elevated opening pressure will need repeat LP(ordered, relayed to IR) until pressure normalizes/stable. He has rapid response yesterday so could not be done. I spoke to pt in regards to importance of repeat LP.   -Continue Unasyn, follow bronch cx.  - Holding off ART therapy for now - F/U bronch studies  Dr. Levern Reader is covering this weekend. I will be back on service on Monday  Evaluation of this patient requires complex antimicrobial therapy evaluation and counseling + isolation needs for disease transmission risk assessment and mitigation   Microbiology:   Antibiotics: Augmentin 6/1 Unasyn 6/2 Hypothyroid Versed 6/1-present Cultures: Blood 6/1 BC ID Cryptococcus neoformans 1/4 strep capitis, 1/4 corynebacterium  6/2 1/2 yeast    SUBJECTIVE: Able to respond more appropriately otday Interval: afebrile overnight  Review of Systems: Review of Systems  All other systems reviewed and are negative.    Scheduled Meds:  Chlorhexidine Gluconate Cloth  6 each Topical Daily   dextrose  10 mL Intravenous Q24H   dextrose  10 mL Intravenous Q24H   feeding supplement (PROSource TF20)  60 mL Per Tube Daily   flucytosine  2,000 mg Per Tube Q6H   metoprolol tartrate  50 mg Per NG tube BID   multivitamin with minerals  1 tablet Per Tube Daily   mupirocin ointment   Topical BID   nystatin  5 mL Oral  QID   mouth rinse  15 mL Mouth Rinse 4 times per day   pantoprazole  (PROTONIX ) IV  40 mg Intravenous Q24H   potassium chloride  40 mEq Per Tube BID   sodium chloride  500 mL Intravenous Q24H   sodium chloride  500 mL Intravenous Q24H   sodium chloride HYPERTONIC  4 mL Nebulization Daily   thiamine  100 mg Per Tube Daily   Continuous Infusions:  amphotericin B liposome (AMBISOME)  200 mg in dextrose 5 % 500 mL IVPB 200 mg (07/06/23 1031)   ampicillin-sulbactam (UNASYN) IV 3 g (07/06/23 1213)   feeding supplement (OSMOLITE 1.5 CAL) 20 mL/hr at 07/06/23 0400   PRN Meds:.acetaminophen , acetaminophen , diphenhydrAMINE **OR** diphenhydrAMINE, hydrALAZINE, labetalol, levalbuterol, meperidine (DEMEROL) injection, metoprolol tartrate, mouth rinse No Known Allergies  OBJECTIVE: Vitals:   07/06/23 1000 07/06/23 1018 07/06/23 1100 07/06/23 1200  BP: (!) 170/110  (!) 186/111 (!) 146/99  Pulse: (!) 118  (!) 119 (!) 104  Resp: (!) 33  (!) 25 19  Temp:    98.6 F (37 C)  TempSrc:    Axillary  SpO2: 97% 97% 95% 93%  Weight:      Height:       Body mass index is 18.75 kg/m.  Physical Exam Constitutional:      General: He is not in acute distress.    Appearance: He is normal weight.  HENT:     Head: Normocephalic and atraumatic.     Right Ear: External ear normal.     Left Ear: External ear normal.     Nose: No congestion or rhinorrhea.     Mouth/Throat:     Mouth: Mucous membranes are moist.     Pharynx: Oropharynx is clear.  Eyes:     Extraocular Movements: Extraocular movements intact.     Conjunctiva/sclera: Conjunctivae normal.     Pupils: Pupils are equal, round, and reactive to light.  Cardiovascular:     Rate and Rhythm: Normal rate and regular rhythm.     Heart sounds: No murmur heard.    No friction rub. No gallop.  Pulmonary:     Effort: Pulmonary effort is normal.  Abdominal:     General: Abdomen is flat. Bowel sounds are normal.     Palpations: Abdomen is soft.  Musculoskeletal:        General: No swelling.     Cervical back: Normal range of motion and neck supple.  Skin:    General: Skin is warm and dry.  Psychiatric:        Mood and Affect: Mood normal.       Lab Results Lab Results  Component Value Date   WBC 6.3 07/06/2023   HGB 14.4 07/06/2023   HCT 42.2 07/06/2023   MCV 87.4 07/06/2023   PLT 129 (L) 07/06/2023    Lab Results   Component Value Date   CREATININE 0.70 07/06/2023   BUN 16 07/06/2023   NA 133 (L) 07/06/2023   K 3.0 (L) 07/06/2023   CL 97 (L) 07/06/2023   CO2 26 07/06/2023    Lab Results  Component Value Date   ALT 10 07/04/2023   AST 57 (H) 07/04/2023   ALKPHOS 56 07/04/2023   BILITOT 1.3 (H) 07/04/2023        Orlie Bjornstad, MD [facility] 07/06/2023, 12:54 PM

## 2023-07-06 NOTE — Progress Notes (Signed)
 PROGRESS NOTE Gerald Guzman  NWG:[id number] DOB: [birth year] DOA: 06/30/2023 PCP: Pcp, No  Brief Narrative/Hospital Course: 23 yom w/ HIV who has not been taking his antiretrovirals for many years has been in jail for last 2 months, feeling very weak dizzy 2-3 days PTA and he stumbled and hurt his right foot great toe while walking down the stairs presented to ED. Of note for last 3 days PTA he has been placed back on his antihypertensives and antiretroviral but having poor appetite last 3 days with nausea vomiting.  In the ER,  patient appears generally weak cachectic nonfocal. X-ray of the right foot shows a wound and has an avulsed nail probably causing the wound. Metabolic panel shows hyponatremia 124 and hypokalemia of 2.3. Glucose of 193. WBC of 12 hemoglobin 17.7. Since patient was complaining of nausea vomiting and poor appetite CT abdomen pelvis was seen in rapid response due to hypoxia which did not show anything acute in the abdomen but did show cavitary lesion in the right costophrenic angle concerning for necrotizing pneumonia/neoplasm/fungal lesions or tuberculosis. Cultures obtained and patient was placed on airborne precautions and admitted for further workup  Patient underwent LP and also underwent bronchoscopy Due to worsening hypoxia transferred to stepdown 6/5, taken off TB precaution  Subjective: Patient seen and examined this morning Remains lethargic nods for response Nursing reports he refused LP: I discussed with him extensively for the need to lower his opening pressure-he is agreeable Overnight remains tachycardic BP stable oxygen down to 2 L-needed up to nonrebreather and HFNC Labs with mild hyponatremia hyperkalemia stable renal function stable CBC with mild thrombocytopenia Core track tube +  Assessment and plan:  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 Follow-up BAL culture data and cytology.  QuantiFERON gold is negative PCP is negative, taken off of TB precaution. Pulmonary ID following-holding ART for now 6/5 transferred to stepdown due to worsening hypoxia. Now on nasal cannula oxygen mentation appears poor. Continue IV Amphotericin, flucytosine via core track, IV Unasyn, supportive care multivitamins He needs repeat LP due to high opening pressure per ID and IR will be informed again this morning-as patient is agreeable Plan is for repeat LP under fluoroscopy due to high opening pressure and follow-up cryptococcal and other CSF studies 6/5  Acute encephalopathy due to patient's meningitis.  Appears weak frail minimally vocalizing continue core track, supportive care delirium precaution   Esophageal thickening Oral thrush: GI following closely plan for endoscopy before discharge. Continue nystatin  Severe hypokalemia  Potassium improving continue to replace  Hyponatremia : Improving, HCTZ discontinued. 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    Hypertensive urgency  Sinus tachycardia-in the setting of AIDS/cryptococcal meningitis bacteremia: BP borderline controlled> change Lopressor via core track, continue as needed labetalol and hydralazine  Right great toe wound: Cont wound care  Erythrocytosis: Monitor labs.  Cachexia/severe malnutrition Body mass index is 18.75 kg/m.: Ng tube feed once resp status better RD following   DVT prophylaxis: Place and maintain sequential compression device Start: 07/01/23 0500 Code Status:   Code Status: Full Code Family  Communication: plan of care discussed with patient at bedside. Patient status is: Remains hospitalized because of severity of illness Level of care:  Stepdown   Dispo: The patient is from: home, recently in Jail-jail officer outside room            Anticipated disposition: TBD Objective: Vitals last 24 hrs: Vitals:   07/06/23 0300 07/06/23 0400 07/06/23 0500 07/06/23 1018  BP: (!) 175/112 (!) 169/113 (!) 169/101   Pulse: (!) 115 (!) 118 (!) 117   Resp: (!) 25 (!) 21 19   Temp: 97.9 F (36.6 C)     TempSrc: Oral     SpO2: 98% 97% 98% 97%  Weight:      Height:        Physical Examination: General exam: Lethargic sleepy nods for response , ngt+ HEENT:Oral mucosa moist, Ear/Nose WNL grossly Respiratory system: Bilaterally diminished BS,no use of accessory muscle Cardiovascular system: S1 & S2 +, No JVD. Gastrointestinal system: Abdomen soft,NT,ND, BS+ Nervous System:  lethargic, weak Extremities: LE edema neg,distal peripheral pulses palpable and warm.  Skin: No rashes,no icterus. MSK: cachectic with thin muscle bulk   Data Reviewed: I have personally reviewed following labs and imaging studies ( see epic result tab) CBC: Recent Labs  Lab 07/01/23 0015 07/03/23 0549 07/04/23 0427 07/05/23 0426 07/06/23 0320  WBC 9.8 10.4 7.3 6.6 6.3  NEUTROABS 8.7*  --   --   --   --   HGB 17.5* 15.0 15.2 14.6 14.4  HCT 50.8 44.8 45.3 43.8 42.2  MCV 84.8 89.4 87.5 88.3 87.4  PLT 223 101* 124* 123* 129*   CMP: Recent Labs  Lab 07/02/23 0353 07/03/23 0542 07/04/23 0427 07/05/23 0418 07/05/23 0426 07/05/23 1009 07/05/23 1642 07/06/23 0320  NA 126* 128* 129*  --  130*  --   --  133*  K 3.3* 3.1* 3.2*  --  3.6  --   --  3.0*  CL 86* 90* 90*  --  93*  --   --  97*  CO2 26 26 26   --  25  --   --  26  GLUCOSE 170* 165* 85  --  122*  --   --  170*  BUN 15 15 11   --  19  --   --  16  CREATININE 0.78 0.56* 0.68  --  0.88  --   --  0.70  CALCIUM 9.2 8.2* 8.3*  --  8.6*  --   --  8.5*   MG 1.8 2.0 2.1 2.3  --   --  2.2 2.0  PHOS  --   --   --   --   --  3.5 2.7 2.7   GFR: Estimated Creatinine Clearance: 108.6 mL/min (by C-G formula based on SCr of 0.7 mg/dL). Recent Labs  Lab 06/30/23 1829 07/04/23 0427  AST 35 57*  ALT 24 10  ALKPHOS 92 56  BILITOT 1.3* 1.3*  PROT 8.3* 6.1*  ALBUMIN 4.1 2.6*   No results for input(s): "LIPASE", "AMYLASE" in the last 168 hours. No results for input(s): "AMMONIA" in the last 168 hours. Coagulation Profile: No results for input(s): "INR", "PROTIME" in the last 168 hours. Unresulted Labs (From admission, onward)     Start     Ordered   07/06/23 0500  Magnesium  Daily,   R     Question:  Specimen collection method  Answer:  Lab=Lab collect   07/05/23 0824   07/05/23 1700  Magnesium  (ICU Tube Feeding: PEPuP )  5A & 5P,   R (with TIMED occurrences)     Question:  Specimen collection method  Answer:  Lab=Lab collect   07/05/23 0940   07/05/23 0941  Phosphorus  (ICU Tube Feeding: PEPuP )  5A & 5P,   R (with TIMED occurrences)     Question:  Specimen collection method  Answer:  Lab=Lab collect   07/05/23 0940   07/05/23 0500  Basic metabolic panel with GFR  Daily,   R     Question:  Specimen collection method  Answer:  Lab=Lab collect   07/04/23 1643   07/05/23 0500  CBC  Daily,   R     Question:  Specimen collection method  Answer:  Lab=Lab collect   07/04/23 1643   07/04/23 1642  Cryptococcal antigen, CSF  Once,   R        07/04/23 1643   07/04/23 1444  Fungus Culture With Stain  RELEASE UPON ORDERING,   TIMED       Comments: Specimen C: Specimen Description RML BAL Phone [phone]         Previous Biopsy:  no Is the patient on airborne/droplet precautions? Yes           Clinical History:  HIV Copy of Report to:  N/A Specimen Disposition: Microbiology     07/04/23 1444   07/04/23 1444  Acid Fast Culture with reflexed sensitivities  RELEASE UPON ORDERING,   TIMED       Comments: Specimen C: Specimen Description RML  BAL Phone [phone]         Previous Biopsy:  no Is the patient on airborne/droplet precautions? Yes           Clinical History:  HIV Copy of Report to:  N/A Specimen Disposition: Microbiology     07/04/23 1444   07/04/23 1444  Acid Fast Smear (AFB)  RELEASE UPON ORDERING,   TIMED       Comments: Specimen C: Specimen Description RML BAL Phone [phone]         Previous Biopsy:  no Is the patient on airborne/droplet precautions? Yes           Clinical History:  HIV Copy of Report to:  N/A Specimen Disposition: Microbiology     07/04/23 1444   07/04/23 1428  Fungus Culture With Stain  RELEASE UPON ORDERING,   TIMED       Comments: Specimen A: Specimen Description tracheal aspirate Phone [phone]         Previous Biopsy:  no Is the patient on airborne/droplet precautions? Yes           Clinical History:  HIV Copy of Report to:  N/A Specimen Disposition: Microbiology     07/04/23 1428   07/04/23 1428  Acid Fast Culture with reflexed sensitivities  RELEASE UPON ORDERING,   TIMED       Comments: Specimen A: Specimen Description tracheal aspirate Phone [phone]         Previous Biopsy:  no Is the patient on airborne/droplet precautions? Yes           Clinical History:  HIV Copy of Report to:  N/A Specimen Disposition: Microbiology     07/04/23 1428   07/04/23 1428  Acid Fast Smear (AFB)  RELEASE UPON ORDERING,   TIMED       Comments: Specimen A: Specimen Description tracheal aspirate Phone [phone]         Previous Biopsy:  no Is the patient on airborne/droplet precautions? Yes           Clinical History:  HIV Copy of Report to:  N/A Specimen Disposition: Microbiology     07/04/23 1428   07/04/23 1051  Culture, fungus without smear  Once,   R        07/04/23 1051   07/04/23 0000  Cryptococcal antigen, CSF  R        07/04/23 1051   07/04/23 0000  CSF cell count with differential  R        07/04/23 1051   07/04/23 0000  Protein, CSF  R        07/04/23 1051    07/04/23 0000  Glucose, CSF  R        07/04/23 1051   07/02/23 1608  Fungus Culture With Stain  Once,   R        07/02/23 1608   07/02/23 1604  Acid Fast Culture with reflexed sensitivities  (AFB smear + Culture w reflexed sensitivities with precautions panel)  Once,   R       Placed in "And" Linked Group   07/02/23 1604   06/30/23 2305  MTB-RIF NAA with AFB Culture, sputum (q8 x 3)  Now then every 8 hours,   R (with TIMED occurrences)     Question Answer Comment  Specimen Source: INDUCED SPUTUM   Patient immune status Immunocompromised      06/30/23 2304           Antimicrobials/Microbiology: Anti-infectives (From admission, onward)    Start     Dose/Rate Route Frequency Ordered Stop   07/05/23 1800  flucytosine (ANCOBON) capsule 2,000 mg        2,000 mg Per Tube Every 6 hours 07/05/23 1245     07/02/23 1215  flucytosine (ANCOBON) capsule 2,000 mg  Status:  Discontinued        2,000 mg Oral Every 6 hours 07/02/23 1115 07/05/23 1245   07/02/23 1200  Ampicillin-Sulbactam (UNASYN) 3 g in sodium chloride 0.9 % 100 mL IVPB        3 g 200 mL/hr over 30 Minutes Intravenous Every 6 hours 07/02/23 1029     07/02/23 1200  flucytosine (ANCOBON) capsule 1,750 mg  Status:  Discontinued        25 mg/kg  68 kg Oral Every 6 hours 07/02/23 1032 07/02/23 1115   07/02/23 1115  piperacillin-tazobactam (ZOSYN) IVPB 3.375 g  Status:  Discontinued        3.375 g 12.5 mL/hr over 240 Minutes Intravenous Every 8 hours 07/02/23 1026 07/02/23 1029   07/02/23 1000  azithromycin  (ZITHROMAX ) tablet 500 mg  Status:  Discontinued        500 mg Oral Daily 07/01/23 1024 07/02/23 0951   07/02/23 1000  azithromycin  (ZITHROMAX ) 250 mg in dextrose 5 % 125 mL IVPB  Status:  Discontinued        250 mg 127.5 mL/hr over 60 Minutes Intravenous Every 24 hours 07/02/23 0951 07/02/23 1029   07/02/23 0600  flucytosine (ANCOBON) capsule 1,500 mg  Status:  Discontinued        1,500 mg Oral Every 6 hours 07/02/23 0329  07/02/23 1032   07/02/23 0430  amphotericin B liposome (AMBISOME) 200 mg in dextrose 5 % 500 mL IVPB        3 mg/kg  68 kg 275 mL/hr over 120 Minutes Intravenous Every 24 hours 07/02/23 0329     07/01/23 2200  amoxicillin-clavulanate (AUGMENTIN) 875-125 MG per tablet 1 tablet  Status:  Discontinued        1 tablet Oral Every 12 hours 07/01/23 1815  07/02/23 0951   07/01/23 0500  azithromycin  (ZITHROMAX ) 500 mg in sodium chloride 0.9 % 250 mL IVPB  Status:  Discontinued        500 mg 250 mL/hr over 60 Minutes Intravenous Every 24 hours 07/01/23 0353 07/01/23 1024   07/01/23 0400  cefTRIAXone  (ROCEPHIN ) 2 g in sodium chloride 0.9 % 100 mL IVPB  Status:  Discontinued        2 g 200 mL/hr over 30 Minutes Intravenous Every 24 hours 07/01/23 0353 07/01/23 1815         Component Value Date/Time   SDES  07/04/2023 1443    BRONCHIAL ALVEOLAR LAVAGE RML Performed at [facility], [address] [place], [place] [address]    SDES  07/04/2023 1443    BRONCHIAL ALVEOLAR LAVAGE RML Performed at [facility], [address], [place], [place] [address]    SPECREQUEST  07/04/2023 1443    NONE Performed at [facility], [address] [place], [place] [address]    SPECREQUEST  07/04/2023 1443    NONE Performed at [facility], [address], [place], [place] [address]    CULT  07/04/2023 1443    CULTURE REINCUBATED FOR BETTER GROWTH Performed at [facility], [address] [place], [place] [address]    CULT PENDING 07/04/2023 1443   REPTSTATUS PENDING 07/04/2023 1443   REPTSTATUS PENDING 07/04/2023 1443    Procedures: Procedure(s) (LRB): ENDOBRONCHIAL ULTRASOUND (EBUS) (Right) BRONCHOSCOPY, WITH BRUSH BIOPSY IRRIGATION, BRONCHUS BRONCHOSCOPY, WITH NEEDLE ASPIRATION BIOPSY Medications reviewed:  Scheduled Meds:  Chlorhexidine Gluconate Cloth  6 each Topical Daily   dextrose  10 mL Intravenous Q24H    dextrose  10 mL Intravenous Q24H   feeding supplement (PROSource TF20)  60 mL Per Tube Daily   flucytosine  2,000 mg Per Tube Q6H   metoprolol tartrate  50 mg Per NG tube BID   multivitamin with minerals  1 tablet Per Tube Daily   mupirocin ointment   Topical BID   nystatin  5 mL Oral QID   mouth rinse  15 mL Mouth Rinse 4 times per day   pantoprazole  (PROTONIX ) IV  40 mg Intravenous Q24H   potassium chloride  40 mEq Per Tube BID   sodium chloride  500 mL Intravenous Q24H   sodium chloride  500 mL Intravenous Q24H   sodium chloride HYPERTONIC  4 mL Nebulization Daily   thiamine  100 mg Per Tube Daily   Continuous Infusions:  amphotericin B liposome (AMBISOME) 200 mg in dextrose 5 % 500 mL IVPB 200 mg (07/06/23 1031)   ampicillin-sulbactam (UNASYN) IV Stopped (07/06/23 0618)   feeding supplement (OSMOLITE 1.5 CAL) 20 mL/hr at 07/06/23 0400    Lesa Rape, MD [facility] 07/06/2023, 10:48 AM

## 2023-07-06 NOTE — Progress Notes (Signed)
 [facility]  Date of Admission:  06/30/2023   Total days of inpatient antibiotics 5  Principal Problem:   Cryptococcal meningitis (HCC) Active Problems:   HIV disease (HCC)   Benign essential HTN   Alcoholism (HCC)   Near syncope   Hyponatremia   Hypokalemia   Nausea & vomiting   Cavitary pneumonia   Hyperglycemia   Mediastinal adenopathy   Acute metabolic encephalopathy          Assessment: 48 yo male hiv last seen in rcid 2018 of antiviral for many years, brought in from jail (where he had stayed for the last 2 months) with a few days poor appetite, n/v, weakness/dizziness and a ground level fall, with ED workup showing cavitary pna/lung mass:   #Cryptococcal neoformans bacteremia with meningitis, Disseminated #Pulmonary mass with cavitary changes - Blood cultures from admission grew cryptococcus.  Staph capitis as well which is likely contamination. - CT chest showed multilobar cavitary mass in right middle lobe measuring up to 5.6 cm with distal satellite cavitary nodules.  Diffuse fluid-filled esophagus with separation wall thickening. - Serum cryptococcal antigen titer>2560 -She has been on antibiotics for the pulmonary mass, TB workup AFB sputum x 3 pending -TTE no veg  #Esophageal thickening - GI consulted who recommended workup for cavitary lung lesion and encephalopathy prior to doing endoscopy.  GI to follow periodically.  May consider EGD prior to discharge. -LP on 6/2 with "62+ cm H2O - exceeded the maximum height of manometer " , ME panel+ crypto., yeast of smear -SP bronch on 6/4 #HIV/AIDS -<35, HIV RNA pending - Currently holding ART  Recommendations: -Continue Amphotericin and flucytosine(Trak tube feeding) - Follow repeat blood cultures to ensure clearance - Follow LP studies.  -Given elevated opening pressure will need repeat LP(ordered, relayed to IR). PT had rapid response as such were not able to LP today,  hopefully tomorrow as opening pressure is highly elevated. In the setting of disseminated/cryptococcal meningitis will  -Continue Unasyn, follow respiratory cultures - MTB RIF negative x 2(tb complex)t, cx pending, IGRA negative, bronch gram stain +encapsulated yeart, affb Cx pending. As bronch shows encapsulated yeast, can take off ariborne precautions for tb given alternate diagnosis of cryptococcus - Holding off ART therapy - F/U bronch studies   Evaluation of this patient requires complex antimicrobial therapy evaluation and counseling + isolation needs for disease transmission risk assessment and mitigation   Microbiology:   Antibiotics: Augmentin 6/1 Unasyn 6/2 Hypothyroid Versed 6/1-present Cultures: Blood Stick/1 BC ID Cryptococcus neoformans and Staphylococcus species Blood cultures growing 1/4 Staph capitis, 1/4 GPR lines Urine  Other   SUBJECTIVE: Able to respond more appropriately otday Interval: Rapid response called  Review of Systems: Review of Systems  All other systems reviewed and are negative.    Scheduled Meds:  Chlorhexidine Gluconate Cloth  6 each Topical Daily   dextrose  10 mL Intravenous Q24H   dextrose  10 mL Intravenous Q24H   feeding supplement (PROSource TF20)  60 mL Per Tube Daily   flucytosine  2,000 mg Per Tube Q6H   metoprolol tartrate  10 mg Intravenous Q6H   multivitamin with minerals  1 tablet Per Tube Daily   mupirocin ointment   Topical BID   nystatin  5 mL Oral QID   mouth rinse  15 mL Mouth Rinse 4 times per day   pantoprazole  (PROTONIX ) IV  40 mg Intravenous Q24H   potassium chloride  40 mEq  Per Tube Daily   sodium chloride  500 mL Intravenous Q24H   sodium chloride  500 mL Intravenous Q24H   sodium chloride HYPERTONIC  4 mL Nebulization Daily   thiamine  100 mg Per Tube Daily   Continuous Infusions:  amphotericin B liposome (AMBISOME) 200 mg in dextrose 5 % 500 mL IVPB Stopped (07/05/23 1408)   ampicillin-sulbactam  (UNASYN) IV Stopped (07/06/23 0039)   feeding supplement (OSMOLITE 1.5 CAL) 20 mL/hr at 07/06/23 0400   PRN Meds:.acetaminophen , acetaminophen , diphenhydrAMINE **OR** diphenhydrAMINE, hydrALAZINE, labetalol, levalbuterol, meperidine (DEMEROL) injection, mouth rinse No Known Allergies  OBJECTIVE: Vitals:   07/06/23 0215 07/06/23 0300 07/06/23 0400 07/06/23 0500  BP:  (!) 175/112 (!) 169/113 (!) 169/101  Pulse: (!) 115 (!) 115 (!) 118 (!) 117  Resp: 19 (!) 25 (!) 21 19  Temp:  97.9 F (36.6 C)    TempSrc:  Oral    SpO2: 95% 98% 97% 98%  Weight:      Height:       Body mass index is 18.75 kg/m.  Physical Exam Constitutional:      General: He is not in acute distress.    Appearance: He is normal weight.  HENT:     Head: Normocephalic and atraumatic.     Right Ear: External ear normal.     Left Ear: External ear normal.     Nose: No congestion or rhinorrhea.     Mouth/Throat:     Mouth: Mucous membranes are moist.     Pharynx: Oropharynx is clear.  Eyes:     Extraocular Movements: Extraocular movements intact.     Conjunctiva/sclera: Conjunctivae normal.     Pupils: Pupils are equal, round, and reactive to light.  Cardiovascular:     Rate and Rhythm: Normal rate and regular rhythm.     Heart sounds: No murmur heard.    No friction rub. No gallop.  Pulmonary:     Effort: Pulmonary effort is normal.  Abdominal:     General: Abdomen is flat. Bowel sounds are normal.     Palpations: Abdomen is soft.  Musculoskeletal:        General: No swelling.     Cervical back: Normal range of motion and neck supple.  Skin:    General: Skin is warm and dry.  Psychiatric:        Mood and Affect: Mood normal.       Lab Results Lab Results  Component Value Date   WBC 6.3 07/06/2023   HGB 14.4 07/06/2023   HCT 42.2 07/06/2023   MCV 87.4 07/06/2023   PLT 129 (L) 07/06/2023    Lab Results  Component Value Date   CREATININE 0.70 07/06/2023   BUN 16 07/06/2023   NA 133 (L)  07/06/2023   K 3.0 (L) 07/06/2023   CL 97 (L) 07/06/2023   CO2 26 07/06/2023    Lab Results  Component Value Date   ALT 10 07/04/2023   AST 57 (H) 07/04/2023   ALKPHOS 56 07/04/2023   BILITOT 1.3 (H) 07/04/2023        Orlie Bjornstad, MD [facility] 07/06/2023, 5:36 AM

## 2023-07-07 DIAGNOSIS — B20 Human immunodeficiency virus [HIV] disease: Secondary | ICD-10-CM | POA: Diagnosis not present

## 2023-07-07 DIAGNOSIS — Z711 Person with feared health complaint in whom no diagnosis is made: Secondary | ICD-10-CM

## 2023-07-07 DIAGNOSIS — R0602 Shortness of breath: Secondary | ICD-10-CM

## 2023-07-07 DIAGNOSIS — F1020 Alcohol dependence, uncomplicated: Secondary | ICD-10-CM

## 2023-07-07 DIAGNOSIS — B451 Cerebral cryptococcosis: Secondary | ICD-10-CM | POA: Diagnosis not present

## 2023-07-07 DIAGNOSIS — Z515 Encounter for palliative care: Secondary | ICD-10-CM | POA: Diagnosis not present

## 2023-07-07 DIAGNOSIS — R531 Weakness: Secondary | ICD-10-CM | POA: Diagnosis not present

## 2023-07-07 DIAGNOSIS — R52 Pain, unspecified: Secondary | ICD-10-CM

## 2023-07-07 DIAGNOSIS — R590 Localized enlarged lymph nodes: Secondary | ICD-10-CM | POA: Diagnosis not present

## 2023-07-07 DIAGNOSIS — Z79899 Other long term (current) drug therapy: Secondary | ICD-10-CM

## 2023-07-07 DIAGNOSIS — Z7189 Other specified counseling: Secondary | ICD-10-CM

## 2023-07-07 LAB — CULTURE, RESPIRATORY W GRAM STAIN

## 2023-07-07 LAB — CSF CULTURE W GRAM STAIN

## 2023-07-07 LAB — CBC
HCT: 41.1 % (ref 39.0–52.0)
Hemoglobin: 13.7 g/dL (ref 13.0–17.0)
MCH: 29.5 pg (ref 26.0–34.0)
MCHC: 33.3 g/dL (ref 30.0–36.0)
MCV: 88.4 fL (ref 80.0–100.0)
Platelets: 135 10*3/uL — ABNORMAL LOW (ref 150–400)
RBC: 4.65 MIL/uL (ref 4.22–5.81)
RDW: 11.6 % (ref 11.5–15.5)
WBC: 6 10*3/uL (ref 4.0–10.5)
nRBC: 0 % (ref 0.0–0.2)

## 2023-07-07 LAB — GLUCOSE, CAPILLARY
Glucose-Capillary: 201 mg/dL — ABNORMAL HIGH (ref 70–99)
Glucose-Capillary: 228 mg/dL — ABNORMAL HIGH (ref 70–99)
Glucose-Capillary: 297 mg/dL — ABNORMAL HIGH (ref 70–99)

## 2023-07-07 LAB — CULTURE, BAL-QUANTITATIVE W GRAM STAIN: Culture: 10000 — AB

## 2023-07-07 LAB — BASIC METABOLIC PANEL WITH GFR
Anion gap: 9 (ref 5–15)
BUN: 12 mg/dL (ref 6–20)
CO2: 27 mmol/L (ref 22–32)
Calcium: 8.3 mg/dL — ABNORMAL LOW (ref 8.9–10.3)
Chloride: 95 mmol/L — ABNORMAL LOW (ref 98–111)
Creatinine, Ser: 0.52 mg/dL — ABNORMAL LOW (ref 0.61–1.24)
GFR, Estimated: >60 mL/min (ref >60)
Glucose, Bld: 204 mg/dL — ABNORMAL HIGH (ref 70–99)
Potassium: 2.9 mmol/L — ABNORMAL LOW (ref 3.5–5.1)
Sodium: 131 mmol/L — ABNORMAL LOW (ref 135–145)

## 2023-07-07 LAB — ANAEROBIC CULTURE W GRAM STAIN

## 2023-07-07 LAB — MAGNESIUM: Magnesium: 1.9 mg/dL (ref 1.7–2.4)

## 2023-07-07 MED ORDER — POTASSIUM CHLORIDE 20 MEQ PO PACK
40.0000 meq | PACK | Freq: Three times a day (TID) | ORAL | Status: DC
  Administered 2023-07-07: 40 meq
  Filled 2023-07-07: qty 2

## 2023-07-07 MED ORDER — METOPROLOL TARTRATE 25 MG PO TABS
100.0000 mg | ORAL_TABLET | Freq: Two times a day (BID) | ORAL | Status: DC
  Administered 2023-07-07: 100 mg via NASOGASTRIC

## 2023-07-07 MED ORDER — HALOPERIDOL LACTATE 5 MG/ML IJ SOLN
1.0000 mg | INTRAMUSCULAR | Status: DC | PRN
Start: 2023-07-07 — End: 2023-07-07

## 2023-07-07 MED ORDER — BIOTENE DRY MOUTH MT LIQD: 15.0000 mL | OROMUCOSAL | Status: DC | PRN

## 2023-07-07 MED ORDER — MAGNESIUM SULFATE 2 GM/50ML IV SOLN
2.0000 g | Freq: Once | INTRAVENOUS | Status: DC
  Filled 2023-07-07: qty 50

## 2023-07-07 MED ORDER — POLYVINYL ALCOHOL 1.4 % OP SOLN: 1.0000 [drp] | Freq: Four times a day (QID) | OPHTHALMIC | Status: DC | PRN

## 2023-07-07 MED ORDER — LABETALOL HCL 5 MG/ML IV SOLN
10.0000 mg | Freq: Once | INTRAVENOUS | Status: AC
  Administered 2023-07-07: 10 mg via INTRAVENOUS
  Filled 2023-07-07: qty 4

## 2023-07-07 MED ORDER — HYDROMORPHONE HCL 1 MG/ML IJ SOLN
0.5000 mg | INTRAMUSCULAR | Status: DC | PRN
  Administered 2023-07-07: 2 mg via INTRAVENOUS
  Filled 2023-07-07: qty 2

## 2023-07-07 MED ORDER — LORAZEPAM 2 MG/ML IJ SOLN: 1.0000 mg | INTRAMUSCULAR | Status: DC | PRN

## 2023-07-07 MED ORDER — GLYCOPYRROLATE 0.2 MG/ML IJ SOLN
0.2000 mg | INTRAMUSCULAR | Status: DC | PRN
Start: 2023-07-07 — End: 2023-07-07

## 2023-07-07 MED ORDER — POTASSIUM CHLORIDE 10 MEQ/100ML IV SOLN
10.0000 meq | INTRAVENOUS | Status: AC
  Administered 2023-07-07 (×4): 10 meq via INTRAVENOUS
  Filled 2023-07-07 (×3): qty 100

## 2023-07-08 LAB — CULTURE, BLOOD (ROUTINE X 2)
Special Requests: ADEQUATE
Special Requests: ADEQUATE

## 2023-07-09 LAB — ANAEROBIC CULTURE W GRAM STAIN

## 2023-07-11 LAB — CULTURE, BLOOD (ROUTINE X 2)
Culture: NO GROWTH
Culture: NO GROWTH
Special Requests: ADEQUATE
Special Requests: ADEQUATE

## 2023-07-11 LAB — CYTOLOGY - NON PAP

## 2023-07-12 LAB — ANAEROBIC CULTURE W GRAM STAIN

## 2023-07-19 LAB — MYCOBACTERIA ID BY MALDI

## 2023-07-19 LAB — MTB-RIF NAA WITH AFB CULTURE, SPUTUM
Acid Fast Culture: POSITIVE — AB
Acid Fast Culture: POSITIVE — AB
Myco tuberculosis Complex: NOT DETECTED
Myco tuberculosis Complex: NOT DETECTED
Source (MTB RIF): 183469

## 2023-07-19 LAB — AFB IDENTIFICATION BY PCR
M avium complex: NEGATIVE
M avium complex: NEGATIVE
M tuberculosis complex: NEGATIVE
M tuberculosis complex: NEGATIVE

## 2023-07-23 LAB — CULTURE, FUNGUS WITHOUT SMEAR

## 2023-07-26 LAB — ACID FAST ID BY PCR AND SUSCEPTIBILITIES
M Tuberculosis Complex: NEGATIVE
M avium complex: NEGATIVE

## 2023-07-26 LAB — ORGANISM ID BY MALDI

## 2023-07-26 LAB — SLOW GROWER BROTH SUSCEP.

## 2023-07-26 LAB — MYCOBACTERIA ID BY MALDI

## 2023-07-26 LAB — ACID FAST CULTURE WITH REFLEXED SENSITIVITIES (MYCOBACTERIA): Acid Fast Culture: POSITIVE — AB

## 2023-07-27 LAB — CULTURE, FUNGUS WITHOUT SMEAR: Culture: NO GROWTH — AB

## 2023-07-31 NOTE — Progress Notes (Addendum)
 Brief ID Note -LP today with opeing pressure 51. Ordered Lp given elevated opening pressure, can be done late tomorrow or on Sunday. -Bronch with afb smear + x2. Sputum mtb negative x 2, Igra negative. I placed airbrone precautions back on. Smear + and sputum mtb/rif - is most c/w NTM but given cavitation on ct and mtb/rif sputum x2 only obtained will place airborne precautions back on.

## 2023-07-31 NOTE — Progress Notes (Addendum)
 NAME:  Gerald Guzman, MRN:  [id number], DOB:  [birth year], LOS: 7 ADMISSION DATE:  06/30/2023, CONSULTATION DATE: 07/01/2023 REFERRING MD: Dr. Joan Mouton, CHIEF COMPLAINT: Cavitary lung disease  History of Present Illness:  Patient was [facility] in [facility] the [facility]   Was complaining of some abdominal pain, did have abdominal CT showing a right lower lobe cavitary process Had a CT scan of the chest showing cavitary process in the right lower lobe cavitary process, right middle lobe cavitary mass, subcarinal adenopathy   He is very sleepy and not very interactive at present Was not having any respiratory complaints History of HIV disease, has not been compliant with treatment  Pertinent  Medical History   Past Medical History:  Diagnosis Date   Alcoholism (HCC) 02/01/2016   HIV infection (HCC)    Hypertension    Palliative care encounter 07/06/2023   Significant Hospital Events: Including procedures, antibiotic start and stop dates in addition to other pertinent events   CT 07/01/2023-right middle lobe cavitary process, right lower lobe cavitary process with subcarinal adenopathy 6/2-lumbar puncture, noted high opening pressures, crypto positive 6/4 bronchoscopy with transbronchial brushings, BAL right middle lobe: AFB positive from tracheal aspirate and BAL 6/5 txf to SDU with worse resp status 6/6 incr pulm hygiene measures  6/6 able to discuss his care with his uncle, desired healthcare decision maker.  CODE STATUS changed to DNR 6/6 repeat lumbar puncture >> opening pressure 51 cc, cloudy CSF 6/6 patient released from custody to facilitate discussions with family, visitation from family, given poor prognosis and concern that he will not survive   Interim History / Subjective:  -CODE STATUS changed to DNR 6/6 -Remains on high flow nasal cannula, 6 L/min (44%) -Underwent repeat LP on 6/6, high opening pressure 51 cc -AFB smears from bronchoscopy are positive, sputum MTB negative and  QuantiFERON gold negative, suggestive of NTB.  Isolation precautions reinitiated   Objective    Blood pressure (!) 171/107, pulse 96, temperature 99.1 F (37.3 C), temperature source Axillary, resp. rate (!) 23, height 6\' 3"  (1.905 m), weight 66.1 kg, SpO2 98%.    FiO2 (%):  [32 %-44 %] 44 %   Intake/Output Summary (Last 24 hours) at 07/21/2023 0755 Last data filed at 07/08/2023 0300 Gross per 24 hour  Intake 1963.68 ml  Output 1400 ml  Net 563.68 ml   Filed Weights   06/30/23 1752 07/02/2023 0500  Weight: 68 kg 66.1 kg    Examination: General: Cachectic, chronically ill man in bed, no distress Neuro: Very lethargic, will open eyes and try to speak to stimulation, garbled HENT: Dry mucous membranes, some upper airway secretions heard Lungs: Bilateral rhonchi, decreased to both bases Cardiovascular: Regular, tachycardic 125 Abdomen: Nondistended, positive bowel sounds  Labs: QuantiFERON 6/1 >> negative AFB smear 6/4 tracheal aspirate >> positive, cx pending Pneumocystis DFA 6/4 >> negative  Resolved problem list   Assessment and Plan   -HIV/AIDS  -Medication non-adherence -Acute encephalopathy, septic/metabolic -Sepsis due to 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  Appreciate ID assistance -Amphotericin and flucytosine  as ordered -Serial LP as per ID plans -Follow bronchoscopy cytology right middle lobe  brushings, although malignancy less likely based on AFB results -Follow mediastinal nodal biopsies (station 7, station 10R), cytology still pending -Empiric Unasyn  given his right middle lobe and lower lobe cavitary lesions, question whether this could be discontinued since his bronchoscopy BAL has not grown out of bacterial specimen.  Will defer to ID as to duration of course -Follow chest x-ray -Push pulmonary hygiene, chest PT, flutter as he can participate.  He is profoundly weak and at risk for respiratory failure - Wean FiO2 as able - Agree with DNR/DNI status given his overall prognosis  Racheal Buddle, MD, PhD 07/12/2023, 8:09 AM [facility] [phone] or if no answer before 7:00PM call [phone] For any issues after 7:00PM please call eLink [phone]

## 2023-07-31 NOTE — Progress Notes (Signed)
 Daily Progress Note   Patient Name: Gerald Guzman       Date: 07/29/2023 DOB: 1975/03/12  Age: 48 y.o. MRN#: [id number] Attending Physician: Barbee Lew, MD Primary Care Physician: Pcp, No Admit Date: 06/30/2023 Length of Stay: 7 days  Reason for Consultation/Follow-up: Establishing goals of care  Subjective:   CC: Patient lethargic.  Following up regarding complex medical decision making.  Subjective:  Review of EMR prior to presenting to bedside.  Also discussed care with RN for medical updates.  Patient has increased work of breathing and has increased secretions.  Patient lethargic and unable to awaken to participate in conversations.  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 Individuals present for the conversation: Discussed care with patient's family throughout the day including patient's uncles Mont Antis and Dovie Gell, patient's significant other, patient's biological daughter, patient's "stepson" patient's longtime friend, patient's niece, and patient's sister Sharri Dee.  Patient was unable to participate in complex medical decision-making due to underlying medical status.  Palliative medicine provider participated in conversations with all listed as patient's family above.  Summary of the conversation:  Initially able to speak to patient's uncle, Mont Antis and Dovie Gell who were at bedside.  Updated them about patient's critical illness and rapid deterioration despite aggressive medical  interventions.  Discussed that patient would be appropriate to focus on comfort at this time.  Noted needed to speak to family of the whole, particularly patient's children, regarding this.  [place] and Dovie Gell acknowledged this and supporting patient's comfort knowing he is at the end of life.  Mont Antis calling family to organize family meeting.  Emphasized importance of meeting as soon as possible.  Able to present to bedside later in afternoon once informed by RN that patient's family and friends were here.  Able to speak in person to patient's biological daughter and patient's "step son".  Also discussed care with patient's significant other, longtime friend, and niece.  Explained patient's underlying critical medical illness.  Explained that despite aggressive medical interventions, patient will not survive hospitalization and is dying from multiple infections due to underlying immunocompromised status.  Patient has been getting full scale of interventions and has still been rapidly deteriorating especially over the past couple of days.  Family appropriately tearful.  Both children present including patient's biological daughter noted that they would not want patient to suffer.  Discussed transitioning to comfort focused care at this time.  Detailed what comfort focused care would and would not provide.  They agree with transition to comfort at this time.  Discussed based on patient's critical state, patient's time is very short potentially hours to days.  Patient could have 1 major aspiration event and quickly die.  Spent time providing emotional support via active listening.  All questions answered at that time.  Able to call patient's sister, Sharri Dee, and provide updates about patient's critical medical illness.  Discussed that transition is being made to focus on comfort at this time since patient is rapidly deteriorating.  Encouraged Monique to come to bedside and that visitors were allowed to at a time.   Monique noted plan to try and come to see her brother today.  Outcome of the conversations and/or documents completed:  Patient transitioned to full  comfort focused care at this time.  I spent 55 minutes providing separately identifiable ACP services with the patient and/or surrogate decision maker in a voluntary, in-person conversation discussing the patient's wishes and goals as detailed in the above note.  Barnett Libel, DO Palliative Medicine Provider  -------------------------------------------------------------------------------------------------------------  Updated care team including hospitalist, RN, IR, infectious disease, and PCCM provider provider regarding transition to full comfort focused care.  Informed by RN of patient's TOD at 1351.  Vital Signs:  BP (!) 171/107   Pulse 96   Temp 99.1 F (37.3 C) (Axillary)   Resp (!) 23   Ht 6\' 3"  (1.905 m)   Wt 66.1 kg   SpO2 98%   BMI 18.21 kg/m   Physical Exam: General: Lethargic, ill-appearing Cardiovascular: Tachycardia noted Respiratory: increased work of breathing noted Skin: no rashes or lesions on visible skin Neuro: Lethargic  Assessment & Plan:   Assessment: Patient is a 49 year old male with a past medical history of HIV who has not been taking antiretrovirals for many years and has been in jail for the last 2 months who was transferred to hospital on 06/02/2023 after he had stumbled and hurt his foot. 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 Patient has also had acute encephalopathy.   Recommendations/Plan: # Complex medical decision making/goals of care:  # Complex medical decision making/goals of care  -Patient unable to participate in  medical decision making secondary to medical status.  -Spoke with patient's legal NOK which is his daughter who is over 67 years old along with patient's stepson and significant other hospital lead.  Also spoke with patient's uncle Mont Antis and Dovie Gell at the hospital today.  Also called and spoke with patient's sister, Sharri Dee, over the phone.  Explained to all family members that patient does not have healthcare power of attorney documentation and based on this the next of kin to make decisions would be patient's adult children over the age of 57.  Children and all family members spoken to today agreed with transitioning to patient to comfort focused care knowing that he is at the end of his life.  -At this time we will discontinue interventions that are no longer focused on comfort such as IV fluids, imaging, or lab work.  Will instead focus on symptom management of pain, dyspnea, and agitation in the setting of end-of-life care.    Code Status: Do not attempt resuscitation (DNR) - Comfort care  # Symptom management Patient is receiving these palliative interventions for symptom management with an intent to improve quality of life.     -Pain/Dyspnea, acute in the setting of end-of-life care                Patient was not on medications for pain previously.                               -Start IV hydromorphone 0.5-2 mg every 30 minute as needed.                  -Anxiety/agitation, in the setting of end-of-life care                               -Start IV  Ativan  1 mg every 4 hours as needed. Continue to adjust based on patient's symptom burden.                                 -Start IV Haldol 1 mg every 4 hours as needed. Continue to adjust based on patient's symptom burden.                   -Secretions, in the setting of end-of-life care                               -Start IV glycopyrrolate 0.2 mg every 4 hours as needed.  # Psychosocial Support:  - 3 children(1 biological daughter and 2  "stepsons" with 1 being in jail), patient's significant other, sisters, 3 uncles, longtime friend, niece  # Discharge Planning: Anticipated Hospital Death  Discussed with: Biological daughter and stepson, patient's significant other, longtime friend of patient's, patient's niece, patient's uncles [place] and [place], patient's sister Monique, hospitalist, RN, infectious disease, PCCM provider  Thank you for allowing the palliative care team to participate in the care DIAN MINAHAN.  Barnett Libel, DO Palliative Care Provider PMT # [phone]  If patient remains symptomatic despite maximum doses, please call PMT at [phone] between 0700 and 1900. Outside of these hours, please call attending, as PMT does not have night coverage.

## 2023-07-31 NOTE — Progress Notes (Signed)
 Pharmacy Antibiotic Note  Gerald Guzman is a 48 y.o. male admitted on 06/30/2023 with cavitary lung lesion/disseminated cryptococcal disease. PMH significant for HIV.  Pharmacy has been consulted for Ambisome  and Flucytosine  dosing.    SCr stable at  0.52 today. K-2.9 after 40 mEq PO BID yesterday. Mg today 1.9 after no replacement. Notably patient is also at risk for refeeding syndrome which could deplete his electrolytes.   Plan: Ambisome  200mg  (3mg /kg) IV q24h Flucytosine  2000 mg po q6h> Rounded up to get closer to 25 mg/kg per dose   Maintenance potassium to 40 mEq TID Potassium chloride  10 meq IV x 4 already ordered by night coverage   Magnesium  sulfate 2 gr IV x1  Pharmacy managing electrolytes while on amphotericin B    Height: 6\' 3"  (190.5 cm) Weight: 66.1 kg (145 lb 11.6 oz) IBW/kg (Calculated) : 84.5  Temp (24hrs), Avg:98.6 F (37 C), Min:97.7 F (36.5 C), Max:99.2 F (37.3 C)  Recent Labs  Lab 07/03/23 0542 07/03/23 0549 07/04/23 0427 07/05/23 0426 07/06/23 0320 07/14/2023 0506  WBC  --  10.4 7.3 6.6 6.3 6.0  CREATININE 0.56*  --  0.68 0.88 0.70 0.52*    Estimated Creatinine Clearance: 105.6 mL/min (A) (by C-G formula based on SCr of 0.52 mg/dL (L)).    No Known Allergies  Antimicrobials this admission: 6/1 Ceftriaxone  x 1 6/1 Augmentin  >>  6/2 6/1 Azithromycin  >>6/2 6/2 Unasyn > 6/2 Ambisome  >> 6/2 Flucytosine  >>   Van Gelinas, PharmD, BCPS 07/22/2023 7:51 AM

## 2023-07-31 NOTE — Progress Notes (Signed)
 PROGRESS NOTE    Gerald Guzman  VQQ:[id number] DOB: 1975/07/17 DOA: 06/30/2023 PCP: Pcp, No  Brief Narrative: 48 yom w/ HIV who has not been taking his antiretrovirals for many years has been in jail for last 2 months, feeling very weak dizzy 2-3 days PTA and he stumbled and hurt his right foot great toe while walking down the stairs presented to ED. Of note for last 3 days PTA he has been placed back on his antihypertensives and antiretroviral but having poor appetite last 3 days with nausea vomiting.  In the ER,  patient appears generally weak cachectic X-ray of the right foot shows a wound and has an avulsed nail probably causing the wound.  Metabolic panel showed hyponatremia 124 and hypokalemia of 2.3. Glucose of 193. WBC of 12 hemoglobin 17.7. Since patient was complaining of nausea vomiting and poor appetite CT abdomen pelvis was done seen by rapid response due to hypoxia which did not show anything acute in the abdomen but did show cavitary lesion in the right costophrenic angle concerning for necrotizing pneumonia/neoplasm/fungal lesions or tuberculosis. Cultures obtained and patient was placed on airborne precautions and admitted for further workup  Patient underwent LP and also underwent bronchoscopy Due to worsening hypoxia transferred to stepdown 6/5, taken off TB precaution and now place back on airborne precautions since positive AFB smears from bronch  Assessment & Plan:   Principal Problem:   Cryptococcal meningitis (HCC) Active Problems:   Cavitary pneumonia   HIV disease (HCC)   Benign essential HTN   Alcoholism (HCC)   Near syncope   Hyponatremia   Hypokalemia   Nausea & vomiting   Hyperglycemia   Mediastinal adenopathy   Acute metabolic encephalopathy   Palliative care encounter   Goals of care, counseling/discussion   Failure to thrive in adult   Counseling and coordination of care   HIV infection (HCC)   DNR (do not resuscitate)    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 Follow-up BAL culture data and cytology.  QuantiFERON gold is negative PCP is negative,  Pulmonary ID following-holding ART for now 6/5 transferred to stepdown due to worsening hypoxia. Now on nasal cannula oxygen mentation appears poor. Continue IV Amphotericin, flucytosine  IV Unasyn , supportive care multivitamins He needs repeat LP due to high opening pressure per ID and IR  Plan is for repeat LP under fluoroscopy due to high opening pressure and follow-up cryptococcal and other CSF studies 6/5   Acute encephalopathy due to CRYPTOCOCCAL meningitis.  CSF positive for cryptococcus.     Esophageal thickening Oral thrush: GI following consider EGD prior to discharge depending on his overall clinical outcome continue nystatin .     Severe hypokalemia  Potassium improving continue to replace   Hyponatremia : Improving, HCTZ discontinued. 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    Hypertensive urgency  Sinus tachycardia-in the setting of AIDS/cryptococcal meningitis bacteremia: Increased Lopressor  200 twice daily   Right great toe wound: Cont wound care   Erythrocytosis: Monitor labs.   Cachexia/severe malnutrition Body mass index is 18.75 kg/m.: Ng tube feed once resp status better RD following     Nutrition Problem: Increased nutrient needs Etiology: chronic  illness   Signs/Symptoms: estimated needs   Interventions: Tube feeding  Estimated body  mass index is 18.21 kg/m as calculated from the following:   Height as of this encounter: 6\' 3"  (1.905 m).   Weight as of this encounter: 66.1 kg.  DVT prophylaxis: SCD  code Status: DNR Family Communication: None at bedside Disposition Plan:  Status is: Inpatient Remains inpatient appropriate because: Acute illness   Consultants:  Infectious disease, interventional radiology, PCCM  Procedures: Lumbar puncture Antimicrobials: Anti-infectives (From admission, onward)    Start     Dose/Rate Route Frequency Ordered Stop   07/05/23 1800  flucytosine  (ANCOBON ) capsule 2,000 mg        2,000 mg Per Tube Every 6 hours 07/05/23 1245     07/02/23 1215  flucytosine  (ANCOBON ) capsule 2,000 mg  Status:  Discontinued        2,000 mg Oral Every 6 hours 07/02/23 1115 07/05/23 1245   07/02/23 1200  Ampicillin -Sulbactam (UNASYN ) 3 g in sodium chloride  0.9 % 100 mL IVPB        3 g 200 mL/hr over 30 Minutes Intravenous Every 6 hours 07/02/23 1029     07/02/23 1200  flucytosine  (ANCOBON ) capsule 1,750 mg  Status:  Discontinued        25 mg/kg  68 kg Oral Every 6 hours 07/02/23 1032 07/02/23 1115   07/02/23 1115  piperacillin -tazobactam (ZOSYN ) IVPB 3.375 g  Status:  Discontinued        3.375 g 12.5 mL/hr over 240 Minutes Intravenous Every 8 hours 07/02/23 1026 07/02/23 1029   07/02/23 1000  azithromycin  (ZITHROMAX ) tablet 500 mg  Status:  Discontinued        500 mg Oral Daily 07/01/23 1024 07/02/23 0951   07/02/23 1000  azithromycin  (ZITHROMAX ) 250 mg in dextrose  5 % 125 mL IVPB  Status:  Discontinued        250 mg 127.5 mL/hr over 60 Minutes Intravenous Every 24 hours 07/02/23 0951 07/02/23 1029   07/02/23 0600  flucytosine  (ANCOBON ) capsule 1,500 mg  Status:  Discontinued        1,500 mg Oral Every 6 hours 07/02/23 0329 07/02/23 1032   07/02/23 0430  amphotericin B  liposome (AMBISOME ) 200 mg in dextrose  5 % 500 mL IVPB        3 mg/kg  68 kg 275 mL/hr over 120 Minutes Intravenous Every 24 hours  07/02/23 0329     07/01/23 2200  amoxicillin -clavulanate (AUGMENTIN ) 875-125 MG per tablet 1 tablet  Status:  Discontinued        1 tablet Oral Every 12 hours 07/01/23 1815 07/02/23 0951   07/01/23 0500  azithromycin  (ZITHROMAX ) 500 mg in sodium chloride  0.9 % 250 mL IVPB  Status:  Discontinued        500 mg 250 mL/hr over 60 Minutes Intravenous Every 24 hours 07/01/23 0353 07/01/23 1024   07/01/23 0400  cefTRIAXone  (ROCEPHIN ) 2 g in sodium chloride  0.9 % 100 mL IVPB  Status:  Discontinued        2 g 200 mL/hr over 30 Minutes Intravenous Every 24 hours 07/01/23 0353 07/01/23 1815        Subjective: Patient is drowsy appears weak and frail not able to have a communication  Overnight blood pressure remains elevated received extra dose of labetalol  Discussed with nurse  Objective: Vitals:   07/19/2023 0412 07/23/2023 0500 07/05/2023 0600 07/21/2023 0700  BP:  (!) 172/104 (!) 163/104 (!) 171/107  Pulse: 96 92 90 96  Resp:  14 14 17  (!) 23  Temp: 99.1 F (37.3 C)     TempSrc: Axillary     SpO2: 97% 96% 98% 98%  Weight:  66.1 kg    Height:        Intake/Output Summary (Last 24 hours) at 07/16/2023 0906 Last data filed at 07/25/2023 0300 Gross per 24 hour  Intake 1963.68 ml  Output 1400 ml  Net 563.68 ml   Filed Weights   06/30/23 1752 07/29/2023 0500  Weight: 68 kg 66.1 kg    Examination:  General exam: Appears frail weak Respiratory system: diminished Respiratory effort normal. Cardiovascular system: S1 & S2 heard, RRR. No JVD, murmurs, rubs, gallops or clicks. No pedal edema. Gastrointestinal system: Abdomen is nondistended, soft and nontender. No organomegaly or masses felt. Normal bowel sounds heard. Central nervous system: lethargic Extremities: no edema  Data Reviewed: I have personally reviewed following labs and imaging studies  CBC: Recent Labs  Lab 07/01/23 0015 07/03/23 0549 07/04/23 0427 07/05/23 0426 07/06/23 0320 07/26/2023 0506  WBC 9.8 10.4 7.3 6.6 6.3 6.0   NEUTROABS 8.7*  --   --   --   --   --   HGB 17.5* 15.0 15.2 14.6 14.4 13.7  HCT 50.8 44.8 45.3 43.8 42.2 41.1  MCV 84.8 89.4 87.5 88.3 87.4 88.4  PLT 223 101* 124* 123* 129* 135*   Basic Metabolic Panel: Recent Labs  Lab 07/03/23 0542 07/04/23 0427 07/05/23 0418 07/05/23 0426 07/05/23 1009 07/05/23 1642 07/06/23 0320 07/06/23 1706 07/13/2023 0506  NA 128* 129*  --  130*  --   --  133*  --  131*  K 3.1* 3.2*  --  3.6  --   --  3.0*  --  2.9*  CL 90* 90*  --  93*  --   --  97*  --  95*  CO2 26 26  --  25  --   --  26  --  27  GLUCOSE 165* 85  --  122*  --   --  170*  --  204*  BUN 15 11  --  19  --   --  16  --  12  CREATININE 0.56* 0.68  --  0.88  --   --  0.70  --  0.52*  CALCIUM 8.2* 8.3*  --  8.6*  --   --  8.5*  --  8.3*  MG 2.0 2.1 2.3  --   --  2.2 2.0 2.0 1.9  PHOS  --   --   --   --  3.5 2.7 2.7 2.2*  --    GFR: Estimated Creatinine Clearance: 105.6 mL/min (A) (by C-G formula based on SCr of 0.52 mg/dL (L)). Liver Function Tests: Recent Labs  Lab 06/30/23 1829 07/04/23 0427  AST 35 57*  ALT 24 10  ALKPHOS 92 56  BILITOT 1.3* 1.3*  PROT 8.3* 6.1*  ALBUMIN 4.1 2.6*   No results for input(s): "LIPASE", "AMYLASE" in the last 168 hours. No results for input(s): "AMMONIA" in the last 168 hours. Coagulation Profile: No results for input(s): "INR", "PROTIME" in the last 168 hours. Cardiac Enzymes: No results for input(s): "CKTOTAL", "CKMB", "CKMBINDEX", "TROPONINI" in the last 168 hours. BNP (last 3 results) No results for input(s): "PROBNP" in the last 8760 hours. HbA1C: No results for input(s): "HGBA1C" in the last 72 hours. CBG: Recent Labs  Lab 07/06/23 1545 07/06/23 2055 07/06/23 2349 07/26/2023 0417 07/03/2023 0824  GLUCAP 190* 197* 203* 201*  228*   Lipid Profile: No results for input(s): "CHOL", "HDL", "LDLCALC", "TRIG", "CHOLHDL", "LDLDIRECT" in the last 72 hours. Thyroid Function Tests: No results for input(s): "TSH", "T4TOTAL", "FREET4",  "T3FREE", "THYROIDAB" in the last 72 hours. Anemia Panel: No results for input(s): "VITAMINB12", "FOLATE", "FERRITIN", "TIBC", "IRON", "RETICCTPCT" in the last 72 hours. Sepsis Labs: No results for input(s): "PROCALCITON", "LATICACIDVEN" in the last 168 hours.  Recent Results (from the past 240 hours)  Culture, blood (Routine X 2) w Reflex to ID Panel     Status: Abnormal   Collection Time: 07/01/23 12:15 AM   Specimen: BLOOD  Result Value Ref Range Status   Specimen Description   Final    BLOOD LEFT ANTECUBITAL Performed at [facility], [address] [place], [place] [address]    Special Requests   Final    BOTTLES DRAWN AEROBIC AND ANAEROBIC Blood Culture adequate volume Performed at [facility], [address], [place], [place] [address]    Culture  Setup Time   Final    GRAM POSITIVE COCCI IN CLUSTERS AEROBIC BOTTLE ONLY CRITICAL RESULT CALLED TO, READ BACK BY AND VERIFIED WITH: PHARMD L POINTDEXTER 07/02/2023 @ 0215 BY AB    Culture (A)  Final    STAPHYLOCOCCUS CAPITIS THE SIGNIFICANCE OF ISOLATING THIS ORGANISM FROM A SINGLE SET OF BLOOD CULTURES WHEN MULTIPLE SETS ARE DRAWN IS UNCERTAIN. PLEASE NOTIFY THE MICROBIOLOGY DEPARTMENT WITHIN ONE WEEK IF SPECIATION AND SENSITIVITIES ARE REQUIRED. Performed at [facility], [address], [place], [place] [address]    Report Status 07/05/2023 FINAL  Final  Culture, blood (Routine X 2) w Reflex to ID Panel     Status: Abnormal   Collection Time: 07/01/23 12:15 AM   Specimen: BLOOD LEFT FOREARM  Result Value Ref Range Status   Specimen Description   Final    BLOOD LEFT FOREARM Performed at [facility], [address], [place], [place] [address]    Special Requests   Final    BOTTLES DRAWN AEROBIC AND ANAEROBIC Blood Culture adequate volume Performed at [facility], [address], [place], [place] [address]    Culture  Setup Time   Final    GRAM  POSITIVE RODS AEROBIC BOTTLE ONLY CRITICAL RESULT CALLED TO, READ BACK BY AND VERIFIED WITH: PHARMD N.GLOGOVAC AT 1308 ON 07/02/2023 BY T.SAAD.    Culture (A)  Final    CORYNEBACTERIUM, GROUP JK Standardized susceptibility testing for this organism is not available. Performed at [facility], [address], [place], [place] [address]    Report Status 07/04/2023 FINAL  Final  Blood Culture ID Panel (Reflexed)     Status: Abnormal   Collection Time: 07/01/23 12:15 AM  Result Value Ref Range Status   Enterococcus faecalis NOT DETECTED NOT DETECTED Final   Enterococcus Faecium NOT DETECTED NOT DETECTED Final   Listeria monocytogenes NOT DETECTED NOT DETECTED Final   Staphylococcus species DETECTED (A) NOT DETECTED Final    Comment: CRITICAL RESULT CALLED TO, READ BACK BY AND VERIFIED WITH: PHARMD L POINTDEXTER 07/02/2023 @ 0215 BY AB    Staphylococcus aureus (BCID) NOT DETECTED NOT DETECTED Final   Staphylococcus epidermidis NOT DETECTED NOT DETECTED Final   Staphylococcus lugdunensis NOT DETECTED NOT DETECTED Final   Streptococcus species NOT DETECTED NOT DETECTED Final   Streptococcus agalactiae NOT DETECTED NOT DETECTED Final   Streptococcus pneumoniae NOT DETECTED NOT DETECTED Final   Streptococcus pyogenes NOT DETECTED NOT DETECTED  Final   A.calcoaceticus-baumannii NOT DETECTED NOT DETECTED Final   Bacteroides fragilis NOT DETECTED NOT DETECTED Final   Enterobacterales NOT DETECTED NOT DETECTED Final   Enterobacter cloacae complex NOT DETECTED NOT DETECTED Final   Escherichia coli NOT DETECTED NOT DETECTED Final   Klebsiella aerogenes NOT DETECTED NOT DETECTED Final   Klebsiella oxytoca NOT DETECTED NOT DETECTED Final   Klebsiella pneumoniae NOT DETECTED NOT DETECTED Final   Proteus species NOT DETECTED NOT DETECTED Final   Salmonella species NOT DETECTED NOT DETECTED Final   Serratia marcescens NOT DETECTED NOT DETECTED Final   Haemophilus influenzae NOT DETECTED NOT  DETECTED Final   Neisseria meningitidis NOT DETECTED NOT DETECTED Final   Pseudomonas aeruginosa NOT DETECTED NOT DETECTED Final   Stenotrophomonas maltophilia NOT DETECTED NOT DETECTED Final   Candida albicans NOT DETECTED NOT DETECTED Final   Candida auris NOT DETECTED NOT DETECTED Final   Candida glabrata NOT DETECTED NOT DETECTED Final   Candida krusei NOT DETECTED NOT DETECTED Final   Candida parapsilosis NOT DETECTED NOT DETECTED Final   Candida tropicalis NOT DETECTED NOT DETECTED Final   Cryptococcus neoformans/gattii DETECTED (A) NOT DETECTED Final    Comment: CRITICAL RESULT CALLED TO, READ BACK BY AND VERIFIED WITH: PHARMD L POINTDEXTER 07/02/2023 @ 0215 BY AB Performed at [facility], [address], [place], [place] [address]   MTB-RIF NAA with AFB Culture, sputum     Status: None (Preliminary result)   Collection Time: 07/01/23 11:44 AM   Specimen: Sputum  Result Value Ref Range Status   Myco tuberculosis Complex NOT DETECTED NOT DETECTED Final   Rifampin Not applicable NOT DETECTED Final   AFB Specimen Processing Concentration  Final    Comment: (NOTE) Performed At: [facility] [address] [place], [place] [id number] Pearlean Botts MD [id number]    Acid Fast Culture PENDING  Incomplete   Source (MTB RIF) [phone]  Final    Comment: Performed at [facility], [address] [place], [place] [address]  Expectorated Sputum Assessment w Gram Stain, Rflx to Resp Cult     Status: None   Collection Time: 07/01/23  6:06 PM   Specimen: Expectorated Sputum  Result Value Ref Range Status   Specimen Description EXPECTORATED SPUTUM  Final   Special Requests Immunocompromised  Final   Sputum evaluation   Final    THIS SPECIMEN IS ACCEPTABLE FOR SPUTUM CULTURE Performed at [facility], [address], [place], [place] [address]    Report Status 07/01/2023 FINAL  Final  Culture, Respiratory w Gram Stain     Status:  None   Collection Time: 07/01/23  6:06 PM  Result Value Ref Range Status   Specimen Description   Final    EXPECTORATED SPUTUM Performed at [facility], [address], [place], [place] [address]    Special Requests   Final    Immunocompromised Reflexed from [phone] Performed at [facility], [address] [place], [place] [address]    Gram Stain   Final    FEW WBC PRESENT, PREDOMINANTLY PMN FEW GRAM NEGATIVE RODS RARE GRAM POSITIVE COCCI RARE GRAM POSITIVE RODS    Culture   Final    FEW Normal respiratory flora-no Staph aureus or Pseudomonas seen Performed at [facility], [address], [place], [place] [address]    Report Status 07/03/2023 FINAL  Final  Blastomyces Antigen     Status: None   Collection Time: 07/01/23  6:30 PM   Specimen: Blood  Result Value Ref Range Status   Blastomyces Antigen None Detected None Detected ng/mL Final    Comment: (NOTE) Reference Interval: None Detected Reportable Range: 0.31 ng/mL - 20.00 ng/mL Results above 20.00 ng/mL are reported as 'Positive, Above the Limit of Quantification' This test was developed and its performance characteristics determined by [employer]. It has not been cleared or approved by the FDA; however, FDA clearance or approval is not currently required for clinical use. The results are not intended to be used as the sole means for clinical diagnosis or patient decisions.    Interpretation Negative  Final   Specimen Type SERUM  Final    Comment: (NOTE) Performed At: [facility] [address] [place], [place] [id number] Mosetta Areola MD WU:[id number]   MTB-RIF NAA with AFB Culture, sputum (q8 x 3)     Status: None (Preliminary result)   Collection Time: 07/01/23  7:52 PM   Specimen: Sputum  Result Value Ref Range Status   Myco tuberculosis Complex NOT DETECTED NOT DETECTED Final   Rifampin Not applicable NOT DETECTED Final   AFB Specimen  Processing Concentration  Final    Comment: (NOTE) Performed At: [facility] [address] [place], [place] [id number] Pearlean Botts MD VH:[id number]    Acid Fast Culture PENDING  Incomplete   Source (MTB RIF) INDUCED SPUTUM  Final    Comment: Performed at [facility], [address] [place], [place] [address]  MRSA Next Gen by PCR, Nasal     Status: None   Collection Time: 07/01/23  7:57 PM   Specimen: Nasal Mucosa; Nasal Swab  Result Value Ref Range Status   MRSA by PCR Next Gen NOT DETECTED NOT DETECTED Final    Comment: (NOTE) The GeneXpert MRSA Assay (FDA approved for NASAL specimens only), is one component of a comprehensive MRSA colonization surveillance program. It is not intended to diagnose MRSA infection nor to guide or monitor treatment for MRSA infections. Test performance is not FDA approved in patients less than 43 years old. Performed at [facility], [address] [place], [place] [address]   Acid Fast Smear (AFB)     Status: None   Collection Time: 07/02/23  4:08 PM   Specimen: CSF  Result Value Ref Range Status   AFB Specimen Processing Concentration  Final   Acid Fast Smear Negative  Final    Comment: (NOTE) Performed At: [facility] [address] [place], [place] [id number] Pearlean Botts MD [id number]    Source (AFB) CSF  Corrected    Comment: Performed at [facility], [address], [place], [place] [address] CORRECTED ON 06/02 AT 2226: PREVIOUSLY REPORTED AS [id number]   CSF culture w Gram Stain     Status: None (Preliminary result)   Collection Time: 07/02/23  4:08 PM   Specimen: CSF; Cerebrospinal Fluid  Result Value Ref Range Status   Specimen Description   Final    CSF Performed at [facility], [address], [place], [place] [address]    Special Requests   Final    NONE Performed at [facility], [address]  [place], [place] [address]    Gram Stain   Final    WBC PRESENT,BOTH PMN AND MONONUCLEAR YEAST PRESENT CYTOSPIN SMEAR CRITICAL RESULT CALLED TO, READ BACK BY AND VERIFIED WITH: J.FELARCA, RN AT 2140 ON 07/02/23 BY N.THOMPSON Performed at [facility],  [address] [place], [place] [address]    Culture ABUNDANT CRYPTOCOCCUS NEOFORMANS  Final   Report Status PENDING  Incomplete  Anaerobic culture w Gram Stain     Status: None (Preliminary result)   Collection Time: 07/02/23  4:08 PM   Specimen: CSF; Cerebrospinal Fluid  Result Value Ref Range Status   Specimen Description   Final    CSF Performed at [facility], [address], [place], [place] [address]    Special Requests   Final    NONE Performed at [facility], [address] [place], [place] [address]    Gram Stain   Final    RARE WBC PRESENT, PREDOMINANTLY PMN FEW BUDDING YEAST SEEN Performed at [facility], [address], [place], [place] [address]    Culture   Final    NO ANAEROBES ISOLATED; CULTURE IN PROGRESS FOR 5 DAYS   Report Status PENDING  Incomplete  Culture, fungus without smear     Status: None (Preliminary result)   Collection Time: 07/02/23  4:08 PM   Specimen: CSF; Cerebrospinal Fluid  Result Value Ref Range Status   Specimen Description   Final    CSF Performed at [facility], [address], [place], [place] [address]    Special Requests   Final    NONE Performed at [facility], [address], [place], [place] [address]    Culture   Final    YEAST CULTURE REINCUBATED FOR BETTER GROWTH Performed at [facility], [address], [place], [place] [address]    Report Status PENDING  Incomplete  Culture, blood (Routine X 2) w Reflex to ID Panel     Status: Abnormal (Preliminary result)   Collection Time: 07/02/23  6:01 PM   Specimen: BLOOD RIGHT HAND  Result Value Ref Range Status    Specimen Description   Final    BLOOD RIGHT HAND Performed at [facility], [address], [place], [place] [address]    Special Requests   Final    BOTTLES DRAWN AEROBIC AND ANAEROBIC Blood Culture adequate volume Performed at [facility], [address], [place], [place] [address]    Culture  Setup Time (A)  Final    YEAST AEROBIC BOTTLE ONLY CRITICAL VALUE NOTED.  VALUE IS CONSISTENT WITH PREVIOUSLY REPORTED AND CALLED VALUE. Performed at [facility], [address] [place], [place] [address]    Culture YEAST  Final   Report Status PENDING  Incomplete  Culture, blood (Routine X 2) w Reflex to ID Panel     Status: None (Preliminary result)   Collection Time: 07/02/23  6:05 PM   Specimen: BLOOD RIGHT HAND  Result Value Ref Range Status   Specimen Description   Final    BLOOD RIGHT HAND Performed at [facility], [address] [place], [place] [address]    Special Requests   Final    BOTTLES DRAWN AEROBIC AND ANAEROBIC Blood Culture adequate volume Performed at [facility], [address], [place], [place] [address]    Culture  Setup Time   Final    YEAST AEROBIC BOTTLE ONLY CRITICAL VALUE NOTED.  VALUE IS CONSISTENT WITH PREVIOUSLY REPORTED AND CALLED VALUE.    Culture   Final    YEAST CULTURE REINCUBATED FOR BETTER GROWTH Performed at [facility], [address], [place], [place] [address]  Report Status PENDING  Incomplete  Fungus Culture With Stain     Status: Abnormal (Preliminary result)   Collection Time: 07/03/23 12:59 AM  Result Value Ref Range Status   Fungus Stain Final report (A)  Final    Comment: (NOTE) Performed At: [facility] [address] [place], [place] [id number] Pearlean Botts MD WU:[id number]    Fungus (Mycology) Culture PENDING  Incomplete   Fungal Source CSF  Final    Comment: Performed at [facility], [address],  [place], [place] [address]  Fungus Culture Result     Status: Abnormal   Collection Time: 07/03/23 12:59 AM  Result Value Ref Range Status   Result 1 Yeast observed (A)  Final    Comment: (NOTE) Performed At: [facility] [address] [place], [place] [id number] Pearlean Botts MD [id number]   Acid Fast Smear (AFB)     Status: Abnormal   Collection Time: 07/04/23  2:26 PM   Specimen: Tracheal Aspirate; Respiratory  Result Value Ref Range Status   AFB Specimen Processing Concentration  Final   Acid Fast Smear Positive (A)  Final    Comment: (NOTE) 1+, 4-36 acid-fast bacilli per 100 fields at 400X magnification, fluorescent smear NOTIFIED MARY A (RBV) AT 02:35PM ON 07/06/2023 CB FAXED TO [phone] Performed At: [facility] [address] [place], [place] [id number] Pearlean Botts MD VZ:[id number]    Source (AFB) TRACHEAL ASPIRATE  Final    Comment: Performed at [facility], [address] [place], [place] [address]  Anaerobic culture w Gram Stain     Status: None (Preliminary result)   Collection Time: 07/04/23  2:26 PM   Specimen: Tracheal Aspirate  Result Value Ref Range Status   Specimen Description   Final    TRACHEAL ASPIRATE Performed at [facility], [address] [place], [place] [address]    Special Requests   Final    NONE Performed at [facility], [address] [place], [place] [address]    Gram Stain   Final    FEW WBC PRESENT,BOTH PMN AND MONONUCLEAR FEW GRAM NEGATIVE RODS RARE GRAM POSITIVE RODS RARE GRAM POSITIVE COCCI IN PAIRS Performed at [facility], [address] [place], [place] [address]    Culture PENDING  Incomplete   Report Status PENDING  Incomplete  Culture, Respiratory w Gram Stain     Status: None (Preliminary result)   Collection Time: 07/04/23  2:26 PM   Specimen: Tracheal Aspirate  Result Value Ref Range Status   Specimen Description   Final     TRACHEAL ASPIRATE Performed at [facility], [address], [place], [place] [address]    Special Requests   Final    NONE Performed at [facility], [address] [place], [place] [address]    Gram Stain   Final    FEW WBC PRESENT,BOTH PMN AND MONONUCLEAR FEW YEAST FEW GRAM POSITIVE RODS    Culture   Final    FEW YEAST CULTURE REINCUBATED FOR BETTER GROWTH Performed at [facility], [address], [place], [place] [address]    Report Status PENDING  Incomplete  Pneumocystis smear by DFA     Status: None   Collection Time: 07/04/23  2:43 PM   Specimen: Bronchial Alveolar Lavage; Respiratory  Result Value Ref Range Status   Specimen Source-PJ[facility] BRONCHIAL ALVEOLAR LAVAGE  Final    Comment: RML   Pneumocystis  jiroveci Ag NEGATIVE  Final    Comment: Performed at [facility] Performed at [facility], [address] [place], [place] [address]   Culture, BAL-quantitative w Gram Stain     Status: None (Preliminary result)   Collection Time: 07/04/23  2:43 PM   Specimen: Bronchial Alveolar Lavage; Respiratory  Result Value Ref Range Status   Specimen Description   Final    BRONCHIAL ALVEOLAR LAVAGE RML Performed at [facility], [address], [place], [place] [address]    Special Requests   Final    NONE Performed at [facility], [address] [place], [place] [address]    Gram Stain   Final    FEW WBC PRESENT,BOTH PMN AND MONONUCLEAR FEW ENCAPSULATED YEAST    Culture   Final    CULTURE REINCUBATED FOR BETTER GROWTH Performed at [facility], [address] [place], [place] [address]    Report Status PENDING  Incomplete  Acid Fast Smear (AFB)     Status: Abnormal   Collection Time: 07/04/23  2:43 PM   Specimen: Bronchial Alveolar Lavage; Respiratory  Result Value Ref Range Status   AFB Specimen Processing Concentration  Final   Acid Fast  Smear Positive (A)  Final    Comment: (NOTE) 2+, 4-36 acid-fast bacilli per 10 fields at 400X magnification, fluorescent smear NOTIFIED MARY A (RBV) AT 02:35PM ON 07/06/2023 CB FAXED TO [phone] Performed At: [facility] [address] [place], [place] [id number] Pearlean Botts MD [id number]    Source (AFB) BRONCHIAL ALVEOLAR LAVAGE  Final    Comment: RML Performed at [facility], [address], [place], [place] [address]   Anaerobic culture w Gram Stain     Status: None (Preliminary result)   Collection Time: 07/04/23  2:43 PM   Specimen: Bronchoalveolar Lavage  Result Value Ref Range Status   Specimen Description   Final    BRONCHIAL ALVEOLAR LAVAGE RML Performed at [facility], [address] [place], [place] [address]    Special Requests   Final    NONE Performed at [facility], [address], [place], [place] [address]    Gram Stain   Final    FEW WBC PRESENT, PREDOMINANTLY PMN RARE GRAM VARIABLE ROD RARE YEAST Performed at [facility], [address] [place], [place] [address]    Culture PENDING  Incomplete   Report Status PENDING  Incomplete  MRSA Next Gen by PCR, Nasal     Status: None   Collection Time: 07/05/23 12:43 PM   Specimen: Nasal Mucosa; Nasal Swab  Result Value Ref Range Status   MRSA by PCR Next Gen NOT DETECTED NOT DETECTED Final    Comment: (NOTE) The GeneXpert MRSA Assay (FDA approved for NASAL specimens only), is one component of a comprehensive MRSA colonization surveillance program. It is not intended to diagnose MRSA infection nor to guide or monitor treatment for MRSA infections. Test performance is not FDA approved in patients less than 69 years old. Performed at [facility], [address] [place], [place] [address]   Culture, blood (Routine X 2) w Reflex to ID Panel     Status: None (Preliminary result)   Collection Time: 07/06/23   2:22 PM   Specimen: BLOOD RIGHT ARM  Result Value Ref Range Status   Specimen Description   Final    BLOOD RIGHT ARM Performed at  [facility], [address], [place], [place] [address]    Special Requests   Final    BOTTLES DRAWN AEROBIC AND ANAEROBIC Blood Culture adequate volume Performed at [facility], [address] [place], [place] [address]    Culture PENDING  Incomplete   Report Status PENDING  Incomplete         Radiology Studies: DG FL GUIDED LUMBAR PUNCTURE Result Date: 07/06/2023 CLINICAL DATA:  Meningitis EXAM: DIAGNOSTIC LUMBAR PUNCTURE UNDER FLUOROSCOPIC GUIDANCE COMPARISON:  07/02/2023 FLUOROSCOPY: Radiation Exposure Index (as provided by the fluoroscopic device): 11.4 mGy Kerma PROCEDURE: Informed consent was obtained from the patient prior to the procedure, including potential complications of headache, allergy, and pain. A time-out was performed. With the patient prone, the lower back was prepped with Betadine. 1% Lidocaine  was used for local anesthesia. Lumbar puncture was performed at the L3-4 level using a 20 gauge needle with return of cloudy CSF with an opening pressure of 51 cm water. 10 ml of CSF were obtained for laboratory studies. The patient tolerated the procedure well and there were no apparent complications. IMPRESSION: Noncomplicated lumbar puncture as detailed above. Increased opening pressure of 51 cc. Cloudy CSF return. Electronically Signed   By: Lore Rode M.D.   On: 07/06/2023 13:10   DG Chest 1 View Result Date: 07/05/2023 CLINICAL DATA:  Oxygen desaturation.  Shortness of breath. EXAM: CHEST  1 VIEW COMPARISON:  Same-day radiograph dated 07/05/2023 at 10:21 a.m. FINDINGS: The heart size and mediastinal contours are unchanged. Enteric tube tip in the stomach. Similar right mid to lower lung volume loss, which could reflect moderate-sized pleural effusion with atelectasis and/or collapse. Persistent right mid to upper lung  opacity. Left lung is clear. No pneumothorax identified. The visualized osseous structures are unchanged. IMPRESSION: 1. Similar right mid to lower lung volume loss, which could reflect a moderate-sized pleural effusion with atelectasis and/or collapse. 2. Persistent right mid to upper lung opacity. Electronically Signed   By: Mannie Seek M.D.   On: 07/05/2023 13:13   DG Chest Port 1 View Result Date: 07/05/2023 CLINICAL DATA:  Shortness of breath. EXAM: PORTABLE CHEST 1 VIEW COMPARISON:  July 04, 2023. FINDINGS: The heart size and mediastinal contours are within normal limits. Left lung is clear. Feeding tube is seen in proximal stomach. Moderate right pleural effusion is noted with right upper lobe opacity concerning for pneumonia or atelectasis. The visualized skeletal structures are unremarkable. IMPRESSION: Moderate right pleural effusion is noted with right upper lobe opacity concerning for pneumonia or atelectasis. Electronically Signed   By: Rosalene Colon M.D.   On: 07/05/2023 10:27    Scheduled Meds:  Chlorhexidine  Gluconate Cloth  6 each Topical Daily   dextrose   10 mL Intravenous Q24H   dextrose   10 mL Intravenous Q24H   feeding supplement (PROSource TF20)  60 mL Per Tube Daily   flucytosine   2,000 mg Per Tube Q6H   metoprolol  tartrate  100 mg Per NG tube BID   multivitamin with minerals  1 tablet Per Tube Daily   mupirocin  ointment   Topical BID   nystatin   5 mL Oral QID   mouth rinse  15 mL Mouth Rinse 4 times per day   pantoprazole  (PROTONIX ) IV  40 mg Intravenous Q24H   potassium chloride   40 mEq Per Tube TID   sodium chloride   500 mL Intravenous Q24H   sodium chloride   500 mL Intravenous Q24H   sodium chloride  HYPERTONIC  4  mL Nebulization Daily   thiamine   100 mg Per Tube Daily   Continuous Infusions:  amphotericin B  liposome (AMBISOME ) 200 mg in dextrose  5 % 500 mL IVPB Stopped (07/06/23 1231)   ampicillin -sulbactam (UNASYN ) IV Stopped (07/01/2023 0555)   feeding  supplement (OSMOLITE 1.5 CAL) 20 mL/hr at 07/21/2023 0100   magnesium  sulfate bolus IVPB     potassium chloride  10 mEq (07/16/2023 0828)     LOS: 7 days    Barbee Lew, MD  07/23/2023, 9:06 AM

## 2023-07-31 NOTE — Progress Notes (Signed)
 Speech Language Pathology Treatment: Dysphagia  Patient Details Name: Gerald Guzman MRN: [id number] DOB: 1975-07-15 Today's Date: 07/30/2023 Time: 4098-1191 SLP Time Calculation (min) (ACUTE ONLY): 11 min  Assessment / Plan / Recommendation Clinical Impression  Pt seen for skilled ST services for ice chip trials. SLP with RN t/o treatment. Per RN, this is the most awake/alert he has been with her. The pt was in bed with his eyes open with his head turned to the left side. Pt made minimal eye contact with the SLP but attempted to vocalize something that was unintelligible. The pt had visible secretions escaping on the left side of his oral cavity and rattled breathing. Pt initially agreeable to oral care with suctioning but after approximately one minute shut his mouth and would not allow for more. Given verbal cues unsuccessful in attempts to administer PO trials of ice chips, pt opened his mouth given tactile cueing of ice on the lips. The pt made little attempts to manipulate the bolus and prolonged oral holding with a delayed swallow initiation and very little hyolaryngeal movement observed. The pt was given a second smaller ice chip, which he had no discernable attempts to manipulate or swallow- PO trials ceased at this time. Pt tightly closed oral cavity to prevent the SLP administering suctioning given mod verbal cues. Safest diet recs remain NPO, SLP to f/u with attempts of increased PO trials when more awake/alert. SLP will follow closely.    HPI HPI: Patient is a 48 y.o. male coming from jail with PMH: HTN,  HIV (not taking antiretrovirals since at least 2018), has been in jail for past two months. He presented to the hospital on 06/30/23 after 2-3 days of feeling dizzy, followed by him stumbling and hurting right foot great toe while walking down stairs. In ER patient appears weak, cachectic. X-ray of right foot shows wound and avulsed nail. CT abdomen pelvis was done which did not show anything  acute in the abdomen but did show cavitary lesion in the right costophrenic angle concerning for necrotizing pneumonia/neoplasm/fungal lesions or tuberculosis.  Cultures obtained and patient was placed on airborne precautions and admitted for further workup. On 6/2 patient observed to have coughing when taking Tylenol  with water as well as concerns for patient not clearing his secretions, having mild  hypoxia and dyspnea. He was made NPO and SLP swallow evaluation ordered.      SLP Plan  Continue with current plan of care          Recommendations  Diet recommendations: NPO Medication Administration: Via alternative means                  Staff/trained caregiver to provide oral care;Oral care QID;Oral care prior to ice chip/H20     Dysphagia, unspecified (R13.10)     Continue with current plan of care    Eda Gone M.S. CCC-SLP

## 2023-07-31 NOTE — Death Summary Note (Signed)
 Death Summary  Gerald Guzman QMV:[id number] DOB: [birth year] DOA: 07/28/2023  PCP: Pcp, No  Admit date: 28-Jul-2023 Date of Death: 2023/08/04 Time of Death: August 11, 1349  Notification: Pcp, No notified of death of 08/04/23   History of present illness:  Gerald Guzman is a 48 y.o. male  w/ HIV who has not been taking his antiretrovirals for many years has been in jail for last 2 months, feeling very weak dizzy 2-3 days PTA and he stumbled and hurt his right foot great toe while walking down the stairs presented to ED. Of note for last 3 days PTA he has been placed back on his antihypertensives and antiretroviral but having poor appetite last 3 days with nausea vomiting.  In the ER,  patient appears generally weak cachectic X-ray of the right foot shows a wound and has an avulsed nail probably causing the wound.  Metabolic panel showed hyponatremia 124 and hypokalemia of 2.3. Glucose of 193. WBC of 12 hemoglobin 17.7. Since patient was complaining of nausea vomiting and poor appetite CT abdomen pelvis was done seen by rapid response due to hypoxia which did not show anything acute in the abdomen but did show cavitary lesion in the right costophrenic angle concerning for necrotizing pneumonia/neoplasm/fungal lesions or tuberculosis. 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 Follow-up BAL culture data and cytology.  QuantiFERON gold is negative PCP is negative patient was treated with Amphotericin Unasyn  and flucytosine .  He was also receiving LPs to decrease the high opening pressure. Palliative care discussed with patient's family and was made comfort care.   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  CSF positive for cryptococcus.      The results of significant diagnostics from this hospitalization (including imaging, microbiology, ancillary and laboratory) are listed below for reference.    Significant Diagnostic Studies: DG FL GUIDED LUMBAR PUNCTURE Result Date: 07/06/2023 CLINICAL DATA:  Meningitis EXAM: DIAGNOSTIC LUMBAR PUNCTURE UNDER FLUOROSCOPIC GUIDANCE COMPARISON:  07/02/2023 FLUOROSCOPY: Radiation Exposure Index (as provided by the fluoroscopic device): 11.4 mGy Kerma PROCEDURE: Informed consent was obtained from the patient prior to the procedure, including potential complications of headache, allergy, and pain. A time-out was performed. With the patient prone, the lower back was prepped with Betadine. 1% Lidocaine  was used for local anesthesia. Lumbar puncture was performed at the L3-4 level using a 20 gauge needle with return of cloudy CSF with an opening pressure of 51 cm water. 10 ml of CSF were obtained for laboratory studies. The patient tolerated the procedure well and there were no apparent complications. IMPRESSION: Noncomplicated lumbar puncture as detailed above. Increased opening pressure of 51 cc. Cloudy CSF return. Electronically Signed   By: Lore Rode M.D.   On: 07/06/2023 13:10   DG Chest 1 View Result Date: 07/05/2023 CLINICAL DATA:  Oxygen desaturation.  Shortness of breath. EXAM: CHEST  1 VIEW COMPARISON:  Same-day radiograph  dated  07/05/2023 at 10:21 a.m. FINDINGS: The heart size and mediastinal contours are unchanged. Enteric tube tip in the stomach. Similar right mid to lower lung volume loss, which could reflect moderate-sized pleural effusion with atelectasis and/or collapse. Persistent right mid to upper lung opacity. Left lung is clear. No pneumothorax identified. The visualized osseous structures are unchanged. IMPRESSION: 1. Similar right mid to lower lung volume loss, which could reflect a moderate-sized pleural effusion with atelectasis and/or collapse. 2. Persistent right mid to upper lung opacity. Electronically Signed   By: Mannie Seek M.D.   On: 07/05/2023 13:13   DG Chest Port 1 View Result Date: 07/05/2023 CLINICAL DATA:  Shortness of breath. EXAM: PORTABLE CHEST 1 VIEW COMPARISON:  July 04, 2023. FINDINGS: The heart size and mediastinal contours are within normal limits. Left lung is clear. Feeding tube is seen in proximal stomach. Moderate right pleural effusion is noted with right upper lobe opacity concerning for pneumonia or atelectasis. The visualized skeletal structures are unremarkable. IMPRESSION: Moderate right pleural effusion is noted with right upper lobe opacity concerning for pneumonia or atelectasis. Electronically Signed   By: Rosalene Colon M.D.   On: 07/05/2023 10:27   DG Abd 1 View Result Date: 07/04/2023 CLINICAL DATA:  Feeding tube placement. EXAM: ABDOMEN - 1 VIEW COMPARISON:  None Available. FINDINGS: Distal tip of feeding tube is seen in expected position of proximal stomach. IMPRESSION: Feeding tube tip seen in expected position of proximal stomach. Electronically Signed   By: Rosalene Colon M.D.   On: 07/04/2023 17:56   DG Chest Port 1 View Result Date: 07/04/2023 CLINICAL DATA:  Status post bronchoscopy and biopsy. EXAM: PORTABLE CHEST 1 VIEW COMPARISON:  Chest radiograph dated 07/02/2023. FINDINGS: There is diffuse airspace opacity in the right middle lobe, new since the prior  radiograph. The left lung is clear. No pleural effusion or pneumothorax. The cardiac silhouette is within normal limits. No acute osseous pathology. IMPRESSION: Right middle lobe airspace opacity, new since the prior radiograph. No pneumothorax. Electronically Signed   By: Angus Bark M.D.   On: 07/04/2023 15:45   DG C-ARM BRONCHOSCOPY Result Date: 07/04/2023 C-ARM BRONCHOSCOPY: Fluoroscopy was utilized by the requesting physician.  No radiographic interpretation.   DG FL GUIDED LUMBAR PUNCTURE Result Date: 07/02/2023 CLINICAL DATA:  Provided history: Altered mental status. Request received for fluoroscopically-guided lumbar puncture. EXAM: DIAGNOSTIC LUMBAR PUNCTURE UNDER FLUOROSCOPIC GUIDANCE COMPARISON:  CT abdomen/pelvis 06/30/2023. 07/01/2023 head CT also reviewed prior to procedure. FLUOROSCOPY: Radiation Exposure Index (as provided by the fluoroscopic device): 4.10 mGy Kerma PROCEDURE: The patient was positioned prone on the fluoroscopy table. An appropriate skin entry site was determined under fluoroscopy and marked. The operator donned sterile gloves and a mask. The lower back was prepped and draped in the usual sterile fashion. Local anesthesia was provided with 1% lidocaine . Lumbar puncture was initially attempted at the L5-S1 level with a 20 gauge spinal needle under intermittent fluoroscopy. However, there was no CSF return at this level. Subsequently, lumbar puncture was performed at the L4-L5 level with a 20 gauge spinal needle under intermittent fluoroscopy. There was return of colorless CSF. The opening pressure exceeded the maximum height of the manometer (greater than 62 cm water). 16 mL of CSF were collected and sent for laboratory studies. The inner stylet was replaced within the needle and the needle was removed in its entirety. A dressing was applied at the skin entry site. The patient tolerated the procedure well and no immediate post-procedure  complication was apparent. The  procedure was performed by Lorinda Root, PA-C, supervised by Dr. Bascom Lily. IMPRESSION: 1. Technically successful fluoroscopically-guided L4-L5 lumbar puncture. 2. The opening pressure exceeded the maximum height of the manometer (greater than 62 cm water). 3. 16 mL of CSF collected and sent for laboratory studies. 4. No immediate post-procedure complication. Electronically Signed   By: Bascom Lily D.O.   On: 07/02/2023 17:30   DG Chest Port 1 View Result Date: 07/02/2023 CLINICAL DATA:  Hypoxia. EXAM: PORTABLE CHEST 1 VIEW COMPARISON:  06/30/2023 FINDINGS: Stable cardiomediastinal contours. No pleural fluid or interstitial edema. Cavitary consolidative change within the right lower lobe is again noted as seen on the CT from 06/30/2023. Left lung appears clear. Visualized osseous structures are intact. IMPRESSION: Cavitary consolidative change within the right lower lobe as seen on the CT from 06/30/2023. Imaging findings compatible with necrotizing pneumonia versus atypical pneumonia such as TB or fungal disease. Recommend follow-up imaging to ensure resolution and to exclude underlying malignancy. Electronically Signed   By: Kimberley Penman M.D.   On: 07/02/2023 05:48   ECHOCARDIOGRAM COMPLETE Result Date: 07/01/2023    ECHOCARDIOGRAM REPORT   Patient Name:   DEVANTA DANIEL Date of Exam: 07/01/2023 Medical Rec #:  [id number]       Height:       75.0 in Accession #:    [id number]      Weight:       150.0 lb Date of Birth:  [birth year]      BSA:          1.942 m Patient Age:    47 years        BP:           158/108 mmHg Patient Gender: M               HR:           102 bpm. Exam Location:  Inpatient Procedure: 2D Echo, Color Doppler and Cardiac Doppler (Both Spectral and Color            Flow Doppler were utilized during procedure). Indications:    Endocarditis.  History:        Patient has no prior history of Echocardiogram examinations.                 Signs/Symptoms:Syncope, Dizziness/Lightheadedness and  Shortness                 of Breath; Risk Factors:Hypertension and Current Smoker. HIV.                 ETOH.  Sonographer:    Raynelle Callow RDCS Referring Phys: [id number] STEPHEN K CHIU  Sonographer Comments: Technically difficult study due to poor echo windows. Patient with very thin habitus, rolled supine during exam. IMPRESSIONS  1. Left ventricular ejection fraction, by estimation, is 60 to 65%. The left ventricle has normal function. The left ventricle has no regional wall motion abnormalities. Indeterminate diastolic filling due to E-A fusion.  2. Right ventricular systolic function is normal. The right ventricular size is normal. Tricuspid regurgitation signal is inadequate for assessing PA pressure.  3. Large pleural effusion.  4. The mitral valve is grossly normal. No evidence of mitral valve regurgitation. No evidence of mitral stenosis.  5. The aortic valve was not well visualized. Aortic valve regurgitation is not visualized. No aortic stenosis is present.  6. The inferior vena cava is normal in size with greater than 50% respiratory variability, suggesting  right atrial pressure of 3 mmHg. Comparison(s): No prior Echocardiogram. FINDINGS  Left Ventricle: Left ventricular ejection fraction, by estimation, is 60 to 65%. The left ventricle has normal function. The left ventricle has no regional wall motion abnormalities. The left ventricular internal cavity size was normal in size. There is  no left ventricular hypertrophy. Indeterminate diastolic filling due to E-A fusion. Right Ventricle: The right ventricular size is normal. Right vetricular wall thickness was not well visualized. Right ventricular systolic function is normal. Tricuspid regurgitation signal is inadequate for assessing PA pressure. Left Atrium: Left atrial size was normal in size. Right Atrium: Right atrial size was normal in size. Pericardium: There is no evidence of pericardial effusion. Mitral Valve: The mitral valve is grossly normal. There  is mild thickening of the anterior and posterior mitral valve leaflet(s). No evidence of mitral valve regurgitation. No evidence of mitral valve stenosis. Tricuspid Valve: The tricuspid valve is normal in structure. Tricuspid valve regurgitation is not demonstrated. No evidence of tricuspid stenosis. Aortic Valve: The aortic valve was not well visualized. Aortic valve regurgitation is not visualized. No aortic stenosis is present. Aortic valve mean gradient measures 3.6 mmHg. Aortic valve peak gradient measures 7.6 mmHg. Aortic valve area, by VTI measures 2.61 cm. Pulmonic Valve: The pulmonic valve was not well visualized. Pulmonic valve regurgitation is not visualized. No evidence of pulmonic stenosis. Aorta: The aortic root and ascending aorta are structurally normal, with no evidence of dilitation. Venous: The inferior vena cava is normal in size with greater than 50% respiratory variability, suggesting right atrial pressure of 3 mmHg. IAS/Shunts: No atrial level shunt detected by color flow Doppler. Additional Comments: There is a large pleural effusion.  LEFT VENTRICLE PLAX 2D LVIDd:         4.00 cm     Diastology LVIDs:         2.60 cm     LV e' medial:    5.55 cm/s LV PW:         0.90 cm     LV E/e' medial:  14.0 LV IVS:        1.00 cm     LV e' lateral:   9.68 cm/s LVOT diam:     2.10 cm     LV E/e' lateral: 8.0 LV SV:         62 LV SV Index:   32 LVOT Area:     3.46 cm  LV Volumes (MOD) LV vol d, MOD A2C: 72.5 ml LV vol d, MOD A4C: 90.7 ml LV vol s, MOD A2C: 21.1 ml LV vol s, MOD A4C: 34.0 ml LV SV MOD A2C:     51.4 ml LV SV MOD A4C:     90.7 ml LV SV MOD BP:      56.5 ml RIGHT VENTRICLE             IVC RV S prime:     11.20 cm/s  IVC diam: 1.30 cm TAPSE (M-mode): 2.6 cm LEFT ATRIUM             Index        RIGHT ATRIUM           Index LA diam:        2.50 cm 1.29 cm/m   RA Area:     11.90 cm LA Vol (A2C):   16.5 ml 8.50 ml/m   RA Volume:   28.70 ml  14.78 ml/m LA Vol (A4C):   19.7 ml 10.14  ml/m LA  Biplane Vol: 19.5 ml 10.04 ml/m  AORTIC VALVE AV Area (Vmax):    2.89 cm AV Area (Vmean):   3.02 cm AV Area (VTI):     2.61 cm AV Vmax:           137.91 cm/s AV Vmean:          87.563 cm/s AV VTI:            0.237 m AV Peak Grad:      7.6 mmHg AV Mean Grad:      3.6 mmHg LVOT Vmax:         115.00 cm/s LVOT Vmean:        76.400 cm/s LVOT VTI:          0.179 m LVOT/AV VTI ratio: 0.75  AORTA Ao Root diam: 3.90 cm Ao Asc diam:  3.00 cm MITRAL VALVE MV Area (PHT): 5.84 cm     SHUNTS MV Decel Time: 130 msec     Systemic VTI:  0.18 m MV E velocity: 77.90 cm/s   Systemic Diam: 2.10 cm MV A velocity: 127.00 cm/s MV E/A ratio:  0.61 Armida Lander MD Electronically signed by Armida Lander MD Signature Date/Time: 07/01/2023/4:26:08 PM    Final    CT CHEST W CONTRAST Result Date: 07/01/2023 CLINICAL DATA:  Pneumonia, complication suspected, xray done EXAM: CT ANGIOGRAPHY CHEST WITH CONTRAST TECHNIQUE: Multidetector CT imaging of the chest was performed using the standard protocol during bolus administration of intravenous contrast. Multiplanar CT image reconstructions and MIPs were obtained to evaluate the vascular anatomy. RADIATION DOSE REDUCTION: This exam was performed according to the departmental dose-optimization program which includes automated exposure control, adjustment of the mA and/or kV according to patient size and/or use of iterative reconstruction technique. CONTRAST:  75mL OMNIPAQUE  IOHEXOL  300 MG/ML  SOLN COMPARISON:  Jun 30, 2023 FINDINGS: Pulmonary Embolism: While the exam was not optimized for the evaluation of the pulmonary arteries, no central pulmonary embolism visualized. Cardiovascular: No cardiomegaly or pericardial effusion. No aortic aneurysm. Mediastinum/Nodes: No mediastinal mass. Enlarged right hilar lymph nodes measuring up to 1.1 cm. Launch subcarinal lymph node measuring 1.9 cm (axial 76). Diffuse fluid-filled dilation of the esophagus. Lungs/Pleura: The midline trachea and bronchi  are patent. Multilobular cavitary mass in the right middle lobe, measuring 3.9 x 5.6 x 2.6 cm (axial 80), with a couple of distal regions of satellite cavitary nodules (axial 87). Thick-walled cavitary lesion with tree-in-bud nodularity again noted in the right lower lobe (axial 121). The cavitary nodule measures 1.5 x 1.9 x 1.1 cm. No lobar consolidation, pleural effusion, or pneumothorax. Musculoskeletal: No acute fracture or destructive bone lesion. Multilevel thoracic osteophytosis. Upper Abdomen: No acute abnormality in the partially visualized upper abdomen. Review of the MIP images confirms the above findings. IMPRESSION: 1. Multilobar cavitary mass in the right middle lobe, measuring 3.9 x 5.6 x 2.6 cm with a couple of distal satellite cavitary nodules (for example, axial 87). The cavitary nodule in the right lower lobe is otherwise unchanged with adjacent tree-in-bud nodularity. While a cavitary squamous cell carcinoma remains in the differential, given the tree-in-bud nodularity, this has the appearance of a multifocal cavitary pneumonia, including TB or fungal etiologies, with endobronchial spread. Aspiration pneumonia remains in the differential. Bronchoscopic sampling recommended. 2. Diffuse fluid-filled esophagus with circumferential wall thickening, worrisome for an infectious or inflammatory esophagitis. Nonemergent upper endoscopy also recommended. 3. Enlarged right hilar and mediastinal lymph nodes, possibly reactive. The subcarinal and right hilar lymph nodes  should be amenable to sampling during bronchoscopy, as clinically warranted. Electronically Signed   By: Rance Burrows M.D.   On: 07/01/2023 08:43   CT HEAD WO CONTRAST ( ) Result Date: 07/01/2023 CLINICAL DATA:  48 year old male with headache.  Malaise. EXAM: CT HEAD WITHOUT CONTRAST TECHNIQUE: Contiguous axial images were obtained from the base of the skull through the vertex without intravenous contrast. RADIATION DOSE REDUCTION:  This exam was performed according to the departmental dose-optimization program which includes automated exposure control, adjustment of the mA and/or kV according to patient size and/or use of iterative reconstruction technique. COMPARISON:  Head CT 05/02/2004. FINDINGS: Brain: Cerebral volume remains normal. No midline shift, ventriculomegaly, mass effect, evidence of mass lesion, intracranial hemorrhage or evidence of cortically based acute infarction. Gray-white matter differentiation is within normal limits throughout the brain. Small cavum septum pellucidum, normal variant. Vascular: No suspicious intracranial vascular hyperdensity. Skull: Intact.  No acute osseous abnormality identified. Sinuses/Orbits: Visualized paranasal sinuses and mastoids are stable and overall well aerated. Other: Visualized orbits and scalp soft tissues are within normal limits. IMPRESSION: Normal noncontrast Head CT. Electronically Signed   By: Marlise Simpers M.D.   On: 07/01/2023 08:35   CT ABDOMEN PELVIS W CONTRAST Result Date: 06/30/2023 CLINICAL DATA:  Acute nonlocalized abdominal pain EXAM: CT ABDOMEN AND PELVIS WITH CONTRAST TECHNIQUE: Multidetector CT imaging of the abdomen and pelvis was performed using the standard protocol following bolus administration of intravenous contrast. RADIATION DOSE REDUCTION: This exam was performed according to the departmental dose-optimization program which includes automated exposure control, adjustment of the mA and/or kV according to patient size and/or use of iterative reconstruction technique. CONTRAST:  OMNIPAQUE  IOHEXOL  300 MG/ML  SOLN COMPARISON:  None Available. FINDINGS: Lower chest: Focal area of infiltration with central cavitary nodule measuring 2.1 cm diameter and located in the right costophrenic angle. This could represent mass a neoplasm such as squamous cell carcinoma or it could represent an inflammatory or infectious process such as necrotizing pneumonia or atypical  process such as TB or fungal infection. Hepatobiliary: No focal liver abnormality is seen. No gallstones, gallbladder wall thickening, or biliary dilatation. Pancreas: Unremarkable. No pancreatic ductal dilatation or surrounding inflammatory changes. Spleen: Normal in size without focal abnormality. Adrenals/Urinary Tract: Adrenal glands are unremarkable. Kidneys are normal, without renal calculi, focal lesion, or hydronephrosis. Bladder is unremarkable. Stomach/Bowel: Stomach, small bowel, and colon are mostly decompressed. No wall thickening or inflammatory changes are appreciated. Appendix is not identified. Vascular/Lymphatic: Aortic atherosclerosis. No enlarged abdominal or pelvic lymph nodes. Reproductive: Prostate is unremarkable. Other: No abdominal wall hernia or abnormality. No abdominopelvic ascites. Musculoskeletal: No acute or significant osseous findings. IMPRESSION: 1. 2.1 cm diameter cavitary nodule with surrounding infiltration in the right costophrenic angle. This may represent neoplasm, necrotizing pneumonia, or atypical pneumonia such as TB or fungal disease. Consider short-term follow-up versus tissue sampling for further evaluation depending on clinical indicators. 2. No acute process demonstrated in the abdomen or pelvis. No evidence of bowel obstruction or inflammation. 3. Aortic atherosclerosis. Electronically Signed   By: Boyce Byes M.D.   On: 06/30/2023 22:45   DG Toe Great Right Result Date: 06/30/2023 CLINICAL DATA:  Right big toe injury in April. Reports toenail is following off. EXAM: RIGHT GREAT TOE COMPARISON:  None Available. FINDINGS: Wound along the medial right great toe, at the level of the nailbed. No radiopaque foreign body. No acute osseous abnormality. No evidence of osteolysis or erosive changes. Joint spaces appear preserved. IMPRESSION: 1. Wound along the  medial right great toe, at the level of the nailbed. No radiopaque foreign body. 2. No acute osseous  abnormality. Electronically Signed   By: Mannie Seek M.D.   On: 06/30/2023 18:46    Microbiology: Recent Results (from the past 240 hours)  Culture, blood (Routine X 2) w Reflex to ID Panel     Status: Abnormal   Collection Time: 07/01/23 12:15 AM   Specimen: BLOOD  Result Value Ref Range Status   Specimen Description   Final    BLOOD LEFT ANTECUBITAL Performed at [facility], [address] [place], [place] [address]    Special Requests   Final    BOTTLES DRAWN AEROBIC AND ANAEROBIC Blood Culture adequate volume Performed at [facility], [address], [place], [place] [address]    Culture  Setup Time   Final    GRAM POSITIVE COCCI IN CLUSTERS AEROBIC BOTTLE ONLY CRITICAL RESULT CALLED TO, READ BACK BY AND VERIFIED WITH: PHARMD L POINTDEXTER 07/02/2023 @ 0215 BY AB    Culture (A)  Final    STAPHYLOCOCCUS CAPITIS THE SIGNIFICANCE OF ISOLATING THIS ORGANISM FROM A SINGLE SET OF BLOOD CULTURES WHEN MULTIPLE SETS ARE DRAWN IS UNCERTAIN. PLEASE NOTIFY THE MICROBIOLOGY DEPARTMENT WITHIN ONE WEEK IF SPECIATION AND SENSITIVITIES ARE REQUIRED. Performed at [facility], [address], [place], [place] [address]    Report Status 07/05/2023 FINAL  Final  Culture, blood (Routine X 2) w Reflex to ID Panel     Status: Abnormal   Collection Time: 07/01/23 12:15 AM   Specimen: BLOOD LEFT FOREARM  Result Value Ref Range Status   Specimen Description   Final    BLOOD LEFT FOREARM Performed at [facility], [address] [place], [place] [address]    Special Requests   Final    BOTTLES DRAWN AEROBIC AND ANAEROBIC Blood Culture adequate volume Performed at [facility], [address], [place], [place] [address]    Culture  Setup Time   Final    GRAM POSITIVE RODS AEROBIC BOTTLE ONLY CRITICAL RESULT CALLED TO, READ BACK BY AND VERIFIED WITH: PHARMD N.GLOGOVAC AT 3295 ON 07/02/2023 BY T.SAAD.    Culture (A)   Final    CORYNEBACTERIUM, GROUP JK Standardized susceptibility testing for this organism is not available. Performed at [facility], [address], [place], [place] [address]    Report Status 07/04/2023 FINAL  Final  Blood Culture ID Panel (Reflexed)     Status: Abnormal   Collection Time: 07/01/23 12:15 AM  Result Value Ref Range Status   Enterococcus faecalis NOT DETECTED NOT DETECTED Final   Enterococcus Faecium NOT DETECTED NOT DETECTED Final   Listeria monocytogenes NOT DETECTED NOT DETECTED Final   Staphylococcus species DETECTED (A) NOT DETECTED Final    Comment: CRITICAL RESULT CALLED TO, READ BACK BY AND VERIFIED WITH: PHARMD L POINTDEXTER 07/02/2023 @ 0215 BY AB    Staphylococcus aureus (BCID) NOT DETECTED NOT DETECTED Final   Staphylococcus epidermidis NOT DETECTED NOT DETECTED Final   Staphylococcus lugdunensis NOT DETECTED NOT DETECTED Final   Streptococcus species NOT DETECTED NOT DETECTED Final   Streptococcus agalactiae NOT DETECTED NOT DETECTED Final   Streptococcus pneumoniae NOT DETECTED NOT DETECTED Final   Streptococcus pyogenes NOT DETECTED NOT DETECTED Final   A.calcoaceticus-baumannii NOT DETECTED NOT DETECTED Final   Bacteroides fragilis NOT DETECTED NOT DETECTED Final   Enterobacterales NOT DETECTED NOT DETECTED Final   Enterobacter cloacae complex  NOT DETECTED NOT DETECTED Final   Escherichia coli NOT DETECTED NOT DETECTED Final   Klebsiella aerogenes NOT DETECTED NOT DETECTED Final   Klebsiella oxytoca NOT DETECTED NOT DETECTED Final   Klebsiella pneumoniae NOT DETECTED NOT DETECTED Final   Proteus species NOT DETECTED NOT DETECTED Final   Salmonella species NOT DETECTED NOT DETECTED Final   Serratia marcescens NOT DETECTED NOT DETECTED Final   Haemophilus influenzae NOT DETECTED NOT DETECTED Final   Neisseria meningitidis NOT DETECTED NOT DETECTED Final   Pseudomonas aeruginosa NOT DETECTED NOT DETECTED Final   Stenotrophomonas maltophilia NOT  DETECTED NOT DETECTED Final   Candida albicans NOT DETECTED NOT DETECTED Final   Candida auris NOT DETECTED NOT DETECTED Final   Candida glabrata NOT DETECTED NOT DETECTED Final   Candida krusei NOT DETECTED NOT DETECTED Final   Candida parapsilosis NOT DETECTED NOT DETECTED Final   Candida tropicalis NOT DETECTED NOT DETECTED Final   Cryptococcus neoformans/gattii DETECTED (A) NOT DETECTED Final    Comment: CRITICAL RESULT CALLED TO, READ BACK BY AND VERIFIED WITH: PHARMD L POINTDEXTER 07/02/2023 @ 0215 BY AB Performed at [facility], [address], [place], [place] [address]   MTB-RIF NAA with AFB Culture, sputum     Status: None (Preliminary result)   Collection Time: 07/01/23 11:44 AM   Specimen: Sputum  Result Value Ref Range Status   Myco tuberculosis Complex NOT DETECTED NOT DETECTED Final   Rifampin Not applicable NOT DETECTED Final   AFB Specimen Processing Concentration  Final    Comment: (NOTE) Performed At: [facility] [address] [place], [place] [id number] Pearlean Botts MD NF:[id number]    Acid Fast Culture PENDING  Incomplete   Source (MTB RIF) [phone]  Final    Comment: Performed at [facility], [address] [place], [place] [address]  Expectorated Sputum Assessment w Gram Stain, Rflx to Resp Cult     Status: None   Collection Time: 07/01/23  6:06 PM   Specimen: Expectorated Sputum  Result Value Ref Range Status   Specimen Description EXPECTORATED SPUTUM  Final   Special Requests Immunocompromised  Final   Sputum evaluation   Final    THIS SPECIMEN IS ACCEPTABLE FOR SPUTUM CULTURE Performed at [facility], [address] [place], [place] [address]    Report Status 07/01/2023 FINAL  Final  Culture, Respiratory w Gram Stain     Status: None   Collection Time: 07/01/23  6:06 PM  Result Value Ref Range Status   Specimen Description   Final    EXPECTORATED SPUTUM Performed at [facility], [address], [place], [place] [address]    Special Requests   Final    Immunocompromised Reflexed from [phone] Performed at [facility], [address] [place], [place] [address]    Gram Stain   Final    FEW WBC PRESENT, PREDOMINANTLY PMN FEW GRAM NEGATIVE RODS RARE GRAM POSITIVE COCCI RARE GRAM POSITIVE RODS    Culture   Final    FEW Normal respiratory flora-no Staph aureus or Pseudomonas seen Performed at [facility], [address] [place], [place] [address]    Report Status 07/03/2023 FINAL  Final  Blastomyces Antigen     Status: None   Collection Time: 07/01/23  6:30 PM   Specimen: Blood  Result Value Ref Range Status   Blastomyces Antigen None Detected None Detected ng/mL Final    Comment: (NOTE) Reference Interval: None  Detected Reportable Range: 0.31 ng/mL - 20.00 ng/mL Results above 20.00 ng/mL are reported as 'Positive, Above the Limit of Quantification' This test was developed and its performance characteristics determined by [employer]. It has not been cleared or approved by the FDA; however, FDA clearance or approval is not currently required for clinical use. The results are not intended to be used as the sole means for clinical diagnosis or patient decisions.    Interpretation Negative  Final   Specimen Type SERUM  Final    Comment: (NOTE) Performed At: [facility] [address] [place], [place] [id number] Mosetta Areola MD ZH:[id number]   MTB-RIF NAA with AFB Culture, sputum (q8 x 3)     Status: None (Preliminary result)   Collection Time: 07/01/23  7:52 PM   Specimen: Sputum  Result Value Ref Range Status   Myco tuberculosis Complex NOT DETECTED NOT DETECTED Final   Rifampin Not applicable NOT DETECTED Final   AFB Specimen Processing Concentration  Final    Comment: (NOTE) Performed At: [facility] [address] [place], [place] [id number] Pearlean Botts MD GM:[id number]     Acid Fast Culture PENDING  Incomplete   Source (MTB RIF) INDUCED SPUTUM  Final    Comment: Performed at [facility], [address] [place], [place] [address]  MRSA Next Gen by PCR, Nasal     Status: None   Collection Time: 07/01/23  7:57 PM   Specimen: Nasal Mucosa; Nasal Swab  Result Value Ref Range Status   MRSA by PCR Next Gen NOT DETECTED NOT DETECTED Final    Comment: (NOTE) The GeneXpert MRSA Assay (FDA approved for NASAL specimens only), is one component of a comprehensive MRSA colonization surveillance program. It is not intended to diagnose MRSA infection nor to guide or monitor treatment for MRSA infections. Test performance is not FDA approved in patients less than 13 years old. Performed at [facility], [address], [place], [place] [address]   Acid Fast Smear (AFB)     Status: None   Collection Time: 07/02/23  4:08 PM   Specimen: CSF  Result Value Ref Range Status   AFB Specimen Processing Concentration  Final   Acid Fast Smear Negative  Final    Comment: (NOTE) Performed At: [facility] [address] [place], [place] [id number] Pearlean Botts MD PI:[id number]    Source (AFB) CSF  Corrected    Comment: Performed at [facility], [address], [place], [place] [address] CORRECTED ON 06/02 AT 2226: PREVIOUSLY REPORTED AS [id number]   CSF culture w Gram Stain     Status: None   Collection Time: 07/02/23  4:08 PM   Specimen: CSF; Cerebrospinal Fluid  Result Value Ref Range Status   Specimen Description   Final    CSF Performed at [facility], [address] [place] [address] [place], [place] [address]    Special Requests   Final    NONE Performed at [facility], [address], [place], [place] [address]    Gram Stain   Final    WBC PRESENT,BOTH PMN AND MONONUCLEAR YEAST PRESENT CYTOSPIN SMEAR CRITICAL RESULT CALLED TO, READ BACK BY AND VERIFIED WITH: J.FELARCA,  RN AT 2140 ON 07/02/23 BY N.THOMPSON Performed at [facility], [address], [place], [place] [address]    Culture ABUNDANT CRYPTOCOCCUS NEOFORMANS  Final   Report Status 07/29/2023 FINAL  Final  Anaerobic culture w Gram Stain  Status: None (Preliminary result)   Collection Time: 07/02/23  4:08 PM   Specimen: CSF; Cerebrospinal Fluid  Result Value Ref Range Status   Specimen Description   Final    CSF Performed at [facility], [address] [place], [place] [address]    Special Requests   Final    NONE Performed at [facility], [address], [place], [place] [address]    Gram Stain   Final    RARE WBC PRESENT, PREDOMINANTLY PMN FEW BUDDING YEAST SEEN Performed at [facility], [address] [place], [place] [address]    Culture   Final    NO ANAEROBES ISOLATED; CULTURE IN PROGRESS FOR 5 DAYS   Report Status PENDING  Incomplete  Culture, fungus without smear     Status: None (Preliminary result)   Collection Time: 07/02/23  4:08 PM   Specimen: CSF; Cerebrospinal Fluid  Result Value Ref Range Status   Specimen Description   Final    CSF Performed at [facility], [address], [place], [place] [address]    Special Requests   Final    NONE Performed at [facility], [address], [place], [place] [address]    Culture   Final    YEAST CULTURE REINCUBATED FOR BETTER GROWTH Performed at [facility], [address] [place], [place] [address]    Report Status PENDING  Incomplete  Culture, blood (Routine X 2) w Reflex to ID Panel     Status: Abnormal (Preliminary result)   Collection Time: 07/02/23  6:01 PM   Specimen: BLOOD RIGHT HAND  Result Value Ref Range Status   Specimen Description   Final    BLOOD RIGHT HAND Performed at [facility], [address] [place], [place] [address]    Special Requests   Final    BOTTLES DRAWN  AEROBIC AND ANAEROBIC Blood Culture adequate volume Performed at [facility], [address], [place], [place] [address]    Culture  Setup Time (A)  Final    YEAST AEROBIC BOTTLE ONLY CRITICAL VALUE NOTED.  VALUE IS CONSISTENT WITH PREVIOUSLY REPORTED AND CALLED VALUE.    Culture   Final    YEAST CULTURE REINCUBATED FOR BETTER GROWTH Performed at [facility], [address], [place], [place] [address]    Report Status PENDING  Incomplete  Culture, blood (Routine X 2) w Reflex to ID Panel     Status: None (Preliminary result)   Collection Time: 07/02/23  6:05 PM   Specimen: BLOOD RIGHT HAND  Result Value Ref Range Status   Specimen Description   Final    BLOOD RIGHT HAND Performed at [facility], [address], [place], [place] [address]    Special Requests   Final    BOTTLES DRAWN AEROBIC AND ANAEROBIC Blood Culture adequate volume Performed at [facility], [address] [place], [place] [address]    Culture  Setup Time   Final    YEAST AEROBIC BOTTLE ONLY CRITICAL VALUE NOTED.  VALUE IS CONSISTENT WITH PREVIOUSLY REPORTED AND CALLED VALUE.    Culture   Final    YEAST CULTURE REINCUBATED FOR BETTER GROWTH Performed at [facility], [address], [place], [place] [address]    Report Status PENDING  Incomplete  Fungus Culture With Stain     Status: Abnormal (Preliminary result)   Collection Time: 07/03/23  12:59 AM  Result Value Ref Range Status   Fungus Stain Final report (A)  Final    Comment: (NOTE) Performed At: [facility] [address] [place], [place] [id number] Pearlean Botts MD [id number]:[id number]    Fungus (Mycology) Culture PENDING  Incomplete   Fungal Source CSF  Final    Comment: Performed at [facility], [address] [place], [place] [address]  Fungus Culture Result     Status: Abnormal   Collection Time: 07/03/23 12:59 AM  Result Value Ref Range Status   Result 1  Yeast observed (A)  Final    Comment: (NOTE) Performed At: [facility] [address] [place], [place] [id number] Pearlean Botts MD [id number]   Acid Fast Smear (AFB)     Status: Abnormal   Collection Time: 07/04/23  2:26 PM   Specimen: Tracheal Aspirate; Respiratory  Result Value Ref Range Status   AFB Specimen Processing Concentration  Final   Acid Fast Smear Positive (A)  Final    Comment: (NOTE) 1+, 4-36 acid-fast bacilli per 100 fields at 400X magnification, fluorescent smear NOTIFIED MARY A (RBV) AT 02:35PM ON 07/06/2023 CB FAXED TO [phone] Performed At: [facility] [address] [place], [place] [id number] Pearlean Botts MD VZ:[id number]    Source (AFB) TRACHEAL ASPIRATE  Final    Comment: Performed at [facility], [address], [place], [place] [address]  Anaerobic culture w Gram Stain     Status: None (Preliminary result)   Collection Time: 07/04/23  2:26 PM   Specimen: Tracheal Aspirate  Result Value Ref Range Status   Specimen Description   Final    TRACHEAL ASPIRATE Performed at [facility], [address] [place], [place] [address]    Special Requests   Final    NONE Performed at [facility], [address], [place], [place] [address]    Gram Stain   Final    FEW WBC PRESENT,BOTH PMN AND MONONUCLEAR FEW GRAM NEGATIVE RODS RARE GRAM POSITIVE RODS RARE GRAM POSITIVE COCCI IN PAIRS Performed at [facility], [address] [place], [place] [address]    Culture PENDING  Incomplete   Report Status PENDING  Incomplete  Culture, Respiratory w Gram Stain     Status: None (Preliminary result)   Collection Time: 07/04/23  2:26 PM   Specimen: Tracheal Aspirate  Result Value Ref Range Status   Specimen Description   Final    TRACHEAL ASPIRATE Performed at [facility], [address], [place], [place] [address]    Special Requests   Final     NONE Performed at [facility], [address], [place], [place] [address]    Gram Stain   Final    FEW WBC PRESENT,BOTH PMN AND MONONUCLEAR FEW YEAST FEW GRAM POSITIVE RODS    Culture   Final    FEW YEAST IDENTIFICATION TO FOLLOW Performed at [facility], [address], [place], [place] [address]    Report Status PENDING  Incomplete  Pneumocystis smear by DFA     Status: None   Collection Time: 07/04/23  2:43 PM   Specimen: Bronchial Alveolar Lavage; Respiratory  Result Value Ref Range Status   Specimen Source-PJSRC BRONCHIAL ALVEOLAR LAVAGE  Final    Comment: RML   Pneumocystis jiroveci Ag NEGATIVE  Final    Comment: Performed at [facility] Performed at [facility], [address] [place], [place] [address]   Culture, BAL-quantitative w Gram Stain     Status: None (Preliminary result)   Collection Time: 07/04/23  2:43 PM   Specimen: Bronchial Alveolar Lavage; Respiratory  Result Value Ref Range Status   Specimen Description   Final    BRONCHIAL ALVEOLAR LAVAGE RML Performed at [facility], [address], [place], [place] [address]    Special Requests   Final    NONE Performed at [facility], [address], [place], [place] [address]    Gram Stain   Final    FEW WBC PRESENT,BOTH PMN AND MONONUCLEAR FEW ENCAPSULATED YEAST    Culture   Final    CULTURE REINCUBATED FOR BETTER GROWTH Performed at [facility], [address], [place], [place] [address]    Report Status PENDING  Incomplete  Acid Fast Smear (AFB)     Status: Abnormal   Collection Time: 07/04/23  2:43 PM   Specimen: Bronchial Alveolar Lavage; Respiratory  Result Value Ref Range Status   AFB Specimen Processing Concentration  Final   Acid Fast Smear Positive (A)  Final    Comment: (NOTE) 2+, 4-36 acid-fast bacilli per 10 fields at 400X magnification, fluorescent smear NOTIFIED MARY A (RBV) AT  02:35PM ON 07/06/2023 CB FAXED TO [phone] Performed At: [facility] [address] [place], [place] [id number] Pearlean Botts MD [id number]    Source (AFB) BRONCHIAL ALVEOLAR LAVAGE  Final    Comment: RML Performed at [facility], [address], [place], [place] [address]   Anaerobic culture w Gram Stain     Status: None (Preliminary result)   Collection Time: 07/04/23  2:43 PM   Specimen: Bronchoalveolar Lavage  Result Value Ref Range Status   Specimen Description   Final    BRONCHIAL ALVEOLAR LAVAGE RML Performed at [facility], [address] [place], [place] [address]    Special Requests   Final    NONE Performed at [facility], [address], [place], [place] [address]    Gram Stain   Final    FEW WBC PRESENT, PREDOMINANTLY PMN RARE GRAM VARIABLE ROD RARE YEAST Performed at [facility], [address] [place], [place] [address]    Culture PENDING  Incomplete   Report Status PENDING  Incomplete  MRSA Next Gen by PCR, Nasal     Status: None   Collection Time: 07/05/23 12:43 PM   Specimen: Nasal Mucosa; Nasal Swab  Result Value Ref Range Status   MRSA by PCR Next Gen NOT DETECTED NOT DETECTED Final    Comment: (NOTE) The GeneXpert MRSA Assay (FDA approved for NASAL specimens only), is one component of a comprehensive MRSA colonization surveillance program. It is not intended to diagnose MRSA infection nor to guide or monitor treatment for MRSA infections. Test performance is not FDA approved in patients less than 77 years old. Performed at [facility], [address] [place], [place] [address]   Culture, blood (Routine X 2) w Reflex to ID Panel     Status: None (Preliminary result)   Collection Time: 07/06/23  2:14 PM   Specimen: BLOOD RIGHT ARM  Result Value Ref Range Status   Specimen Description   Final    BLOOD RIGHT ARM Performed at [facility], [address], [place], [place] [address]    Special Requests   Final    BOTTLES DRAWN AEROBIC  AND ANAEROBIC Blood Culture adequate volume Performed at [facility], [address], [place], [place] [address]    Culture   Final    NO GROWTH < 24 HOURS Performed at [facility], [address], [place], [place] [address]    Report Status PENDING  Incomplete  Culture, blood (Routine X 2) w Reflex to ID Panel     Status: None (Preliminary result)   Collection Time: 07/06/23  2:22 PM   Specimen: BLOOD RIGHT ARM  Result Value Ref Range Status   Specimen Description   Final    BLOOD RIGHT ARM Performed at [facility], [address], [place], [place] [address]    Special Requests   Final    BOTTLES DRAWN AEROBIC AND ANAEROBIC Blood Culture adequate volume Performed at [facility], [address], [place], [place] [address]    Culture   Final    NO GROWTH < 24 HOURS Performed at [facility], [address] [place], [place] [address]    Report Status PENDING  Incomplete     Labs: Basic Metabolic Panel: Recent Labs  Lab 07/03/23 0542 07/04/23 0427 07/05/23 0418 07/05/23 0426 07/05/23 1009 07/05/23 1642 07/06/23 0320 07/06/23 1706 07/11/2023 0506  NA 128* 129*  --  130*  --   --  133*  --  131*  K 3.1* 3.2*  --  3.6  --   --  3.0*  --  2.9*  CL 90* 90*  --  93*  --   --  97*  --  95*  CO2 26 26  --  25  --   --  26  --  27  GLUCOSE 165* 85  --  122*  --   --  170*  --  204*  BUN 15 11  --  19  --   --  16  --  12  CREATININE 0.56* 0.68  --  0.88  --   --  0.70  --  0.52*  CALCIUM 8.2* 8.3*  --  8.6*  --   --  8.5*  --  8.3*  MG 2.0 2.1 2.3  --   --  2.2 2.0 2.0 1.9  PHOS  --   --   --   --  3.5 2.7 2.7 2.2*  --    Liver Function Tests: Recent Labs  Lab 06/30/23 1829 07/04/23 0427  AST 35 57*  ALT 24 10  ALKPHOS 92 56  BILITOT 1.3* 1.3*  PROT 8.3* 6.1*  ALBUMIN 4.1 2.6*   No results  for input(s): "LIPASE", "AMYLASE" in the last 168 hours. No results for input(s): "AMMONIA" in the last 168 hours. CBC: Recent Labs  Lab 07/01/23 0015 07/03/23 0549 07/04/23 0427 07/05/23 0426 07/06/23 0320 07/03/2023 0506  WBC 9.8 10.4 7.3 6.6 6.3 6.0  NEUTROABS 8.7*  --   --   --   --   --   HGB 17.5* 15.0 15.2 14.6 14.4 13.7  HCT 50.8 44.8 45.3 43.8 42.2 41.1  MCV 84.8 89.4 87.5 88.3 87.4 88.4  PLT 223 101* 124* 123* 129* 135*   Cardiac Enzymes: No results for input(s): "CKTOTAL", "CKMB", "CKMBINDEX", "TROPONINI" in the last 168 hours. D-Dimer No results for input(s): "DDIMER" in the last 72 hours. BNP: Invalid input(s): "POCBNP" CBG: Recent Labs  Lab 07/06/23 2055 07/06/23 2349 07/05/2023 0417 07/08/2023 0824 07/29/2023 1149  GLUCAP 197* 203* 201* 228* 297*   Anemia  work up No results for input(s): "VITAMINB12", "FOLATE", "FERRITIN", "TIBC", "IRON", "RETICCTPCT" in the last 72 hours. Urinalysis    Component Value Date/Time   COLORURINE YELLOW 06/30/2023 2323   APPEARANCEUR CLEAR 06/30/2023 2323   LABSPEC 1.026 06/30/2023 2323   PHURINE 6.0 06/30/2023 2323   GLUCOSEU 50 (A) 06/30/2023 2323   HGBUR NEGATIVE 06/30/2023 2323   BILIRUBINUR NEGATIVE 06/30/2023 2323   KETONESUR NEGATIVE 06/30/2023 2323   PROTEINUR 30 (A) 06/30/2023 2323   UROBILINOGEN 0.2 01/01/2014 1050   NITRITE NEGATIVE 06/30/2023 2323   LEUKOCYTESUR NEGATIVE 06/30/2023 2323   Sepsis Labs Recent Labs  Lab 07/04/23 0427 07/05/23 0426 07/06/23 0320 07/26/2023 0506  WBC 7.3 6.6 6.3 6.0    SIGNED:  Barbee Lew, MD  [facility] 07/13/2023, 2:29 PM

## 2023-07-31 DEATH — deceased

## 2023-08-02 ENCOUNTER — Ambulatory Visit: Payer: Self-pay | Admitting: Infectious Diseases

## 2023-08-02 LAB — FUNGUS CULTURE WITH STAIN

## 2023-08-02 LAB — FUNGAL ORGANISM REFLEX

## 2023-08-02 LAB — FUNGUS CULTURE RESULT

## 2023-08-03 LAB — ACID FAST ID BY PCR AND SUSCEPTIBILITIES
M Tuberculosis Complex: NEGATIVE
M avium complex: NEGATIVE

## 2023-08-03 LAB — SLOW GROWER BROTH SUSCEP.
Amikacin: 2
Ciprofloxacin: 1
Doxycycline: 8
Linezolid: 2
Streptomycin: 8

## 2023-08-03 LAB — ORGANISM ID BY MALDI

## 2023-08-03 LAB — ACID FAST CULTURE WITH REFLEXED SENSITIVITIES (MYCOBACTERIA): Acid Fast Culture: POSITIVE — AB

## 2023-08-03 LAB — MYCOBACTERIA ID BY MALDI

## 2023-08-05 LAB — FUNGUS CULTURE RESULT

## 2023-08-05 LAB — FUNGUS CULTURE WITH STAIN

## 2023-08-05 LAB — FUNGAL ORGANISM REFLEX

## 2023-08-09 LAB — FUNGUS CULTURE WITH STAIN

## 2023-08-09 LAB — FUNGAL ORGANISM REFLEX

## 2023-08-09 LAB — FUNGUS CULTURE RESULT

## 2023-08-14 LAB — ACID FAST CULTURE WITH REFLEXED SENSITIVITIES (MYCOBACTERIA): Acid Fast Culture: NEGATIVE
